# Patient Record
Sex: Female | Born: 1975 | Hispanic: Yes | Marital: Married | Smoking: Never smoker
Health system: Southern US, Community
[De-identification: ages and names within clinical notes are randomized; demographics above are authoritative.]

## PROBLEM LIST (undated history)

## (undated) DIAGNOSIS — G43909 Migraine, unspecified, not intractable, without status migrainosus: Secondary | ICD-10-CM

## (undated) DIAGNOSIS — N6310 Unspecified lump in the right breast, unspecified quadrant: Secondary | ICD-10-CM

## (undated) DIAGNOSIS — M546 Pain in thoracic spine: Secondary | ICD-10-CM

## (undated) DIAGNOSIS — M545 Low back pain: Secondary | ICD-10-CM

## (undated) DIAGNOSIS — G4730 Sleep apnea, unspecified: Secondary | ICD-10-CM

---

## 2006-07-06 ENCOUNTER — Other Ambulatory Visit: Admission: RE | Admit: 2006-07-06 | Discharge: 2006-07-06 | Payer: Self-pay | Admitting: Obstetrics and Gynecology

## 2006-11-06 ENCOUNTER — Ambulatory Visit (HOSPITAL_COMMUNITY): Admission: RE | Admit: 2006-11-06 | Discharge: 2006-11-06 | Payer: Self-pay | Admitting: Obstetrics and Gynecology

## 2006-11-10 ENCOUNTER — Inpatient Hospital Stay (HOSPITAL_COMMUNITY): Admission: AD | Admit: 2006-11-10 | Discharge: 2006-11-10 | Payer: Self-pay | Admitting: Obstetrics and Gynecology

## 2007-03-05 ENCOUNTER — Observation Stay (HOSPITAL_COMMUNITY): Admission: AD | Admit: 2007-03-05 | Discharge: 2007-03-06 | Payer: Self-pay | Admitting: Obstetrics and Gynecology

## 2007-03-28 ENCOUNTER — Ambulatory Visit (HOSPITAL_COMMUNITY): Admission: RE | Admit: 2007-03-28 | Discharge: 2007-03-28 | Payer: Self-pay | Admitting: Obstetrics and Gynecology

## 2007-04-03 ENCOUNTER — Inpatient Hospital Stay (HOSPITAL_COMMUNITY): Admission: AD | Admit: 2007-04-03 | Discharge: 2007-04-05 | Payer: Self-pay | Admitting: Obstetrics and Gynecology

## 2007-05-01 ENCOUNTER — Ambulatory Visit: Admission: RE | Admit: 2007-05-01 | Discharge: 2007-05-01 | Payer: Self-pay | Admitting: Obstetrics and Gynecology

## 2007-07-16 ENCOUNTER — Other Ambulatory Visit: Admission: RE | Admit: 2007-07-16 | Discharge: 2007-07-16 | Payer: Self-pay | Admitting: Obstetrics and Gynecology

## 2008-05-03 ENCOUNTER — Emergency Department (HOSPITAL_COMMUNITY): Admission: EM | Admit: 2008-05-03 | Discharge: 2008-05-03 | Payer: Self-pay | Admitting: Emergency Medicine

## 2008-06-05 ENCOUNTER — Encounter: Admission: RE | Admit: 2008-06-05 | Discharge: 2008-06-05 | Payer: Self-pay | Admitting: Obstetrics and Gynecology

## 2008-08-20 ENCOUNTER — Inpatient Hospital Stay (HOSPITAL_COMMUNITY): Admission: AD | Admit: 2008-08-20 | Discharge: 2008-08-22 | Payer: Self-pay | Admitting: Obstetrics and Gynecology

## 2008-11-14 ENCOUNTER — Other Ambulatory Visit: Admission: RE | Admit: 2008-11-14 | Discharge: 2008-11-14 | Payer: Self-pay | Admitting: Obstetrics and Gynecology

## 2009-01-27 ENCOUNTER — Inpatient Hospital Stay (HOSPITAL_COMMUNITY): Admission: EM | Admit: 2009-01-27 | Discharge: 2009-01-29 | Payer: Self-pay | Admitting: Emergency Medicine

## 2009-01-28 ENCOUNTER — Encounter (INDEPENDENT_AMBULATORY_CARE_PROVIDER_SITE_OTHER): Payer: Self-pay | Admitting: General Surgery

## 2009-01-28 HISTORY — PX: CHOLECYSTECTOMY: SHX55

## 2009-06-04 ENCOUNTER — Ambulatory Visit (HOSPITAL_COMMUNITY): Admission: RE | Admit: 2009-06-04 | Discharge: 2009-06-04 | Payer: Self-pay | Admitting: Internal Medicine

## 2009-07-20 ENCOUNTER — Emergency Department (HOSPITAL_COMMUNITY): Admission: EM | Admit: 2009-07-20 | Discharge: 2009-07-20 | Payer: Self-pay | Admitting: Emergency Medicine

## 2009-08-12 ENCOUNTER — Emergency Department (HOSPITAL_COMMUNITY): Admission: EM | Admit: 2009-08-12 | Discharge: 2009-08-12 | Payer: Self-pay | Admitting: Emergency Medicine

## 2009-12-02 ENCOUNTER — Other Ambulatory Visit: Admission: RE | Admit: 2009-12-02 | Discharge: 2009-12-02 | Payer: Self-pay | Admitting: Obstetrics and Gynecology

## 2010-10-10 ENCOUNTER — Emergency Department (HOSPITAL_COMMUNITY)
Admission: EM | Admit: 2010-10-10 | Discharge: 2010-10-10 | Disposition: A | Discharge status: Home / Self Care | Payer: Managed Care, Other (non HMO) | Attending: Emergency Medicine | Admitting: Emergency Medicine

## 2010-10-10 DIAGNOSIS — L259 Unspecified contact dermatitis, unspecified cause: Secondary | ICD-10-CM | POA: Insufficient documentation

## 2010-10-10 DIAGNOSIS — T363X5A Adverse effect of macrolides, initial encounter: Secondary | ICD-10-CM | POA: Insufficient documentation

## 2010-10-10 DIAGNOSIS — R109 Unspecified abdominal pain: Secondary | ICD-10-CM | POA: Insufficient documentation

## 2010-11-15 ENCOUNTER — Other Ambulatory Visit (HOSPITAL_COMMUNITY)
Admission: RE | Admit: 2010-11-15 | Discharge: 2010-11-15 | Disposition: A | Discharge status: Home / Self Care | Payer: Managed Care, Other (non HMO) | Source: Ambulatory Visit | Attending: Obstetrics and Gynecology | Admitting: Obstetrics and Gynecology

## 2010-11-15 ENCOUNTER — Other Ambulatory Visit: Payer: Self-pay | Admitting: Obstetrics and Gynecology

## 2010-11-15 DIAGNOSIS — Z01419 Encounter for gynecological examination (general) (routine) without abnormal findings: Secondary | ICD-10-CM | POA: Insufficient documentation

## 2010-12-07 LAB — URINALYSIS, ROUTINE W REFLEX MICROSCOPIC
Nitrite: NEGATIVE
Protein, ur: 100 mg/dL — AB
Urobilinogen, UA: 0.2 mg/dL (ref 0.0–1.0)
pH: 7 (ref 5.0–8.0)

## 2010-12-07 LAB — DIFFERENTIAL
Basophils Absolute: 0.1 10*3/uL (ref 0.0–0.1)
Eosinophils Relative: 2 % (ref 0–5)
Lymphs Abs: 2.8 10*3/uL (ref 0.7–4.0)
Monocytes Relative: 6 % (ref 3–12)
Neutrophils Relative %: 79 % — ABNORMAL HIGH (ref 43–77)

## 2010-12-07 LAB — APTT: aPTT: 27 seconds (ref 24–37)

## 2010-12-07 LAB — BASIC METABOLIC PANEL
BUN: 10 mg/dL (ref 6–23)
CO2: 26 mEq/L (ref 19–32)
Creatinine, Ser: 0.54 mg/dL (ref 0.4–1.2)
GFR calc Af Amer: >60 mL/min (ref >60)
GFR calc non Af Amer: >60 mL/min (ref >60)
Glucose, Bld: 105 mg/dL — ABNORMAL HIGH (ref 70–99)
Sodium: 137 mEq/L (ref 135–145)

## 2010-12-07 LAB — URINE CULTURE

## 2010-12-07 LAB — URINE MICROSCOPIC-ADD ON

## 2010-12-07 LAB — CBC
RBC: 4.49 MIL/uL (ref 3.87–5.11)
RDW: 13.1 % (ref 11.5–15.5)
WBC: 21.4 10*3/uL — ABNORMAL HIGH (ref 4.0–10.5)

## 2010-12-07 LAB — GC/CHLAMYDIA PROBE AMP, GENITAL
Chlamydia, DNA Probe: NEGATIVE
GC Probe Amp, Genital: NEGATIVE

## 2010-12-07 LAB — ABO/RH: ABO/RH(D): A POS

## 2010-12-07 LAB — HCG, QUANTITATIVE, PREGNANCY: hCG, Beta Chain, Quant, S: <2 m[IU]/mL (ref <5)

## 2010-12-08 LAB — URINALYSIS, ROUTINE W REFLEX MICROSCOPIC
Glucose, UA: 100 mg/dL — AB
Hgb urine dipstick: NEGATIVE
Ketones, ur: NEGATIVE mg/dL
Nitrite: NEGATIVE
Specific Gravity, Urine: 1.009 (ref 1.005–1.030)
Urobilinogen, UA: 0.2 mg/dL (ref 0.0–1.0)
pH: 7 (ref 5.0–8.0)

## 2010-12-14 LAB — COMPREHENSIVE METABOLIC PANEL
BUN: 9 mg/dL (ref 6–23)
CO2: 26 mEq/L (ref 19–32)
Chloride: 101 mEq/L (ref 96–112)
Creatinine, Ser: 0.53 mg/dL (ref 0.4–1.2)
GFR calc non Af Amer: >60 mL/min (ref >60)
Total Bilirubin: 0.9 mg/dL (ref 0.3–1.2)

## 2010-12-14 LAB — URINALYSIS, ROUTINE W REFLEX MICROSCOPIC
Bilirubin Urine: NEGATIVE
Glucose, UA: NEGATIVE mg/dL
Ketones, ur: NEGATIVE mg/dL
pH: 6 (ref 5.0–8.0)

## 2010-12-14 LAB — DIFFERENTIAL
Basophils Absolute: 0 10*3/uL (ref 0.0–0.1)
Basophils Absolute: 0 10*3/uL (ref 0.0–0.1)
Basophils Relative: 0 % (ref 0–1)
Basophils Relative: 0 % (ref 0–1)
Eosinophils Absolute: 0.2 10*3/uL (ref 0.0–0.7)
Eosinophils Relative: 0 % (ref 0–5)
Eosinophils Relative: 2 % (ref 0–5)
Lymphocytes Relative: 9 % — ABNORMAL LOW (ref 12–46)
Monocytes Absolute: 0.9 10*3/uL (ref 0.1–1.0)
Monocytes Relative: 10 % (ref 3–12)
Neutro Abs: 12.7 10*3/uL — ABNORMAL HIGH (ref 1.7–7.7)
Neutro Abs: 6.1 10*3/uL (ref 1.7–7.7)

## 2010-12-14 LAB — CBC
HCT: 38.8 % (ref 36.0–46.0)
Hemoglobin: 12 g/dL (ref 12.0–15.0)
Hemoglobin: 13.8 g/dL (ref 12.0–15.0)
MCHC: 35.5 g/dL (ref 30.0–36.0)
MCV: 86.7 fL (ref 78.0–100.0)
MCV: 87.8 fL (ref 78.0–100.0)
RBC: 4.48 MIL/uL (ref 3.87–5.11)
RDW: 14.1 % (ref 11.5–15.5)
WBC: 14.8 10*3/uL — ABNORMAL HIGH (ref 4.0–10.5)

## 2010-12-14 LAB — POCT I-STAT 4, (NA,K, GLUC, HGB,HCT)
Glucose, Bld: 88 mg/dL (ref 70–99)
Potassium: 4.2 mEq/L (ref 3.5–5.1)

## 2010-12-14 LAB — BASIC METABOLIC PANEL
CO2: 28 mEq/L (ref 19–32)
Calcium: 8.3 mg/dL — ABNORMAL LOW (ref 8.4–10.5)
Chloride: 106 mEq/L (ref 96–112)
Glucose, Bld: 81 mg/dL (ref 70–99)
Sodium: 138 mEq/L (ref 135–145)

## 2010-12-14 LAB — HEPATIC FUNCTION PANEL
AST: 40 U/L — ABNORMAL HIGH (ref 0–37)
Bilirubin, Direct: 0.1 mg/dL (ref 0.0–0.3)
Indirect Bilirubin: 0.6 mg/dL (ref 0.3–0.9)
Total Bilirubin: 0.7 mg/dL (ref 0.3–1.2)

## 2010-12-14 LAB — PREGNANCY, URINE: Preg Test, Ur: NEGATIVE

## 2010-12-14 LAB — LIPASE, BLOOD: Lipase: 60 U/L — ABNORMAL HIGH (ref 11–59)

## 2011-01-17 IMAGING — CT CT ABDOMEN W/ CM
2 of 7 series · 13 of 46 positions shown, 18 images · IV contrast (agent unspecified)
Comparison: Abdominal ultrasound 01/27/2009.

CT ABDOMEN

CLINICAL DATA: Hematuria.  Abdominal pain.  Bleeding.

CT ABDOMEN AND PELVIS WITH CONTRAST
TECHNIQUE: Multidetector CT imaging of the abdomen and pelvis was
performed using the standard protocol following bolus
administration of intravenous contrast.
Contrast: 100 ml [id number].

[Series 2: abd_pel_with 5.0 b40f · axial · 0.66mm/px · z∈[-394,-4]mm · 10 of 91 slices shown, 15 images]
[im 7/91  soft-tissue]
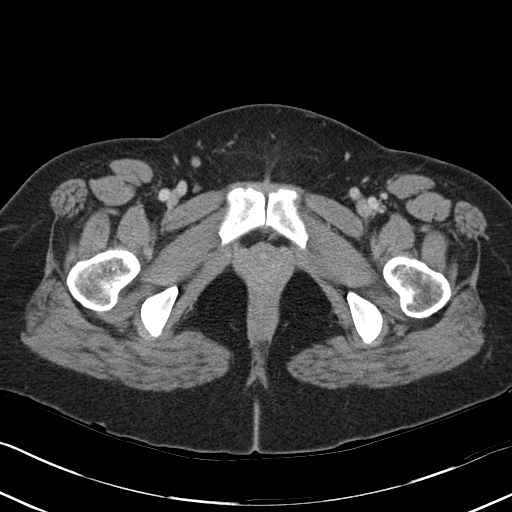
[im 7/91  bone]
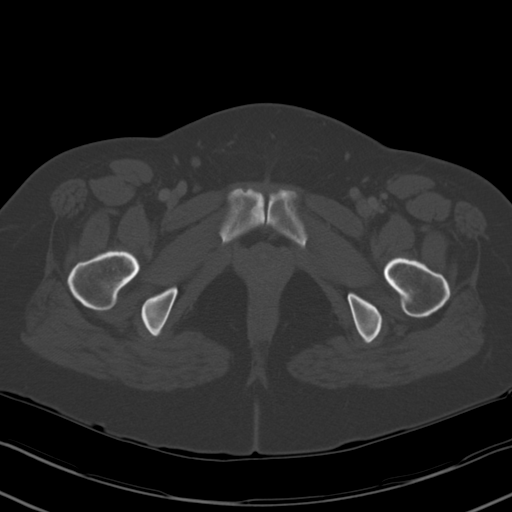
[im 19/91  soft-tissue]
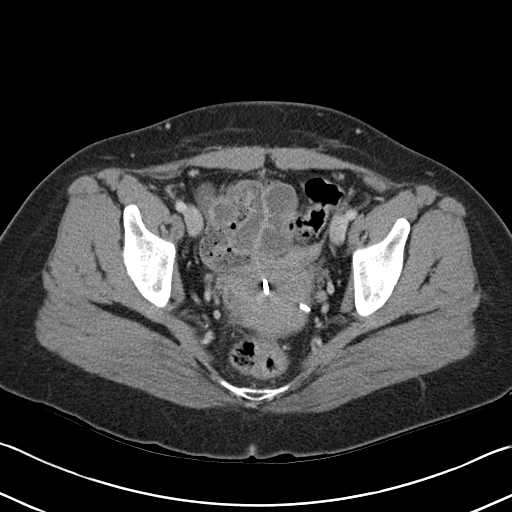
[im 25/91  soft-tissue]
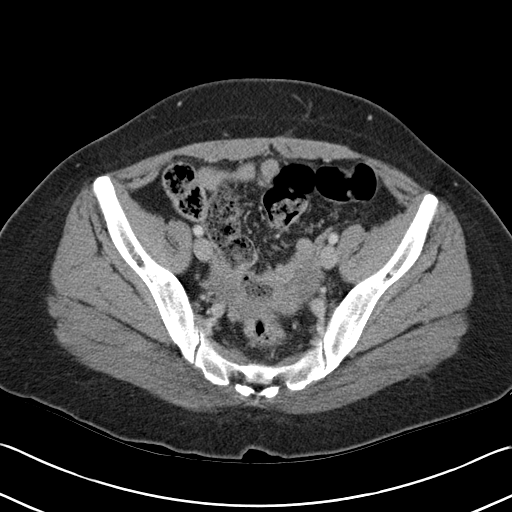
[im 37/91  soft-tissue]
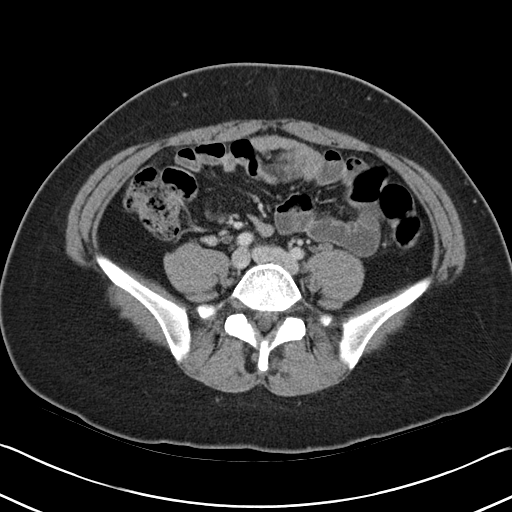
[im 49/91  soft-tissue]
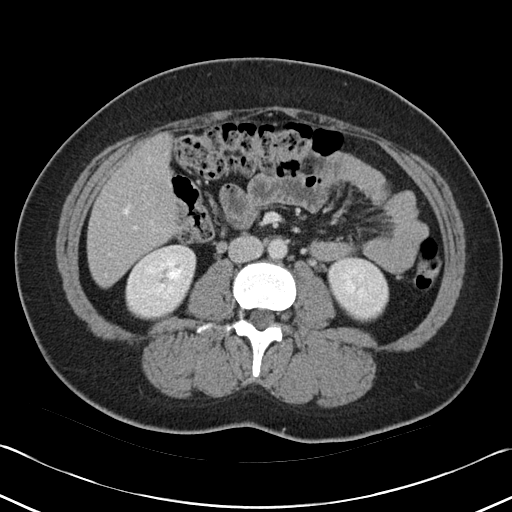
[im 55/91  soft-tissue]
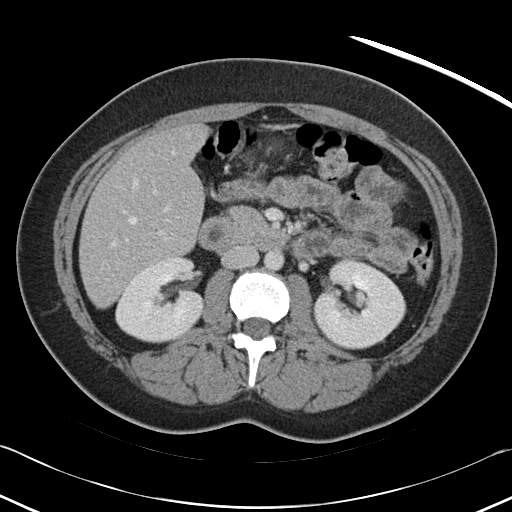
[im 67/91  soft-tissue]
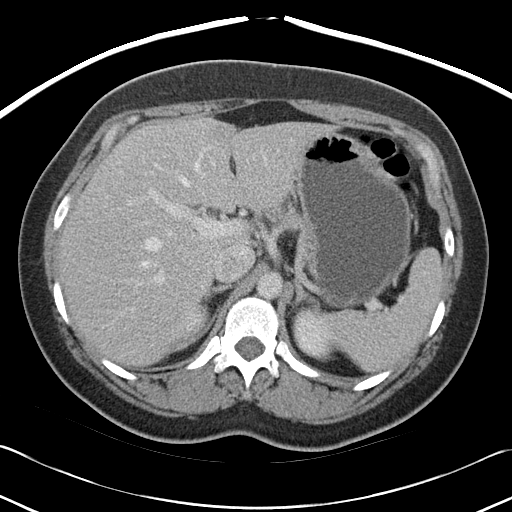
[im 67/91  lung]
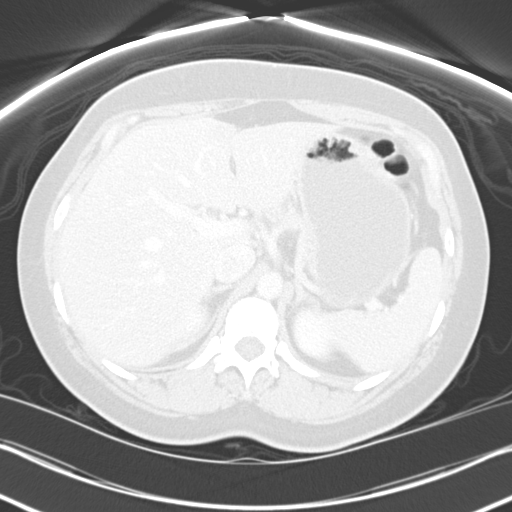
[im 73/91  soft-tissue]
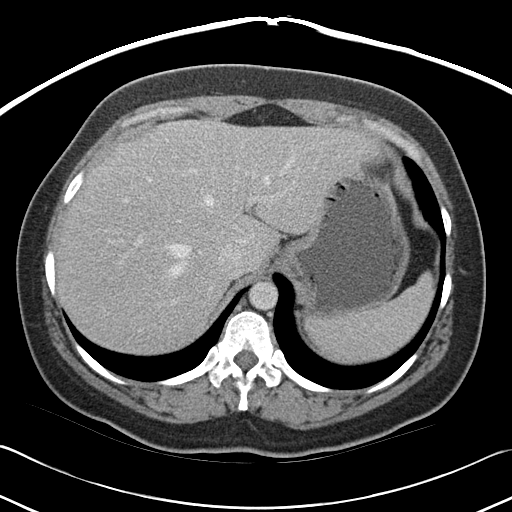
[im 73/91  lung]
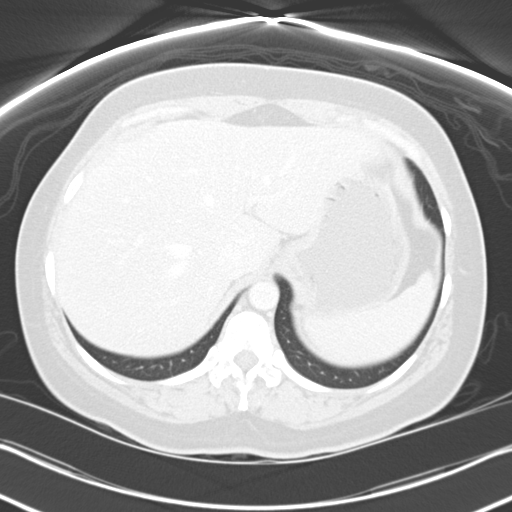
[im 79/91  lung]
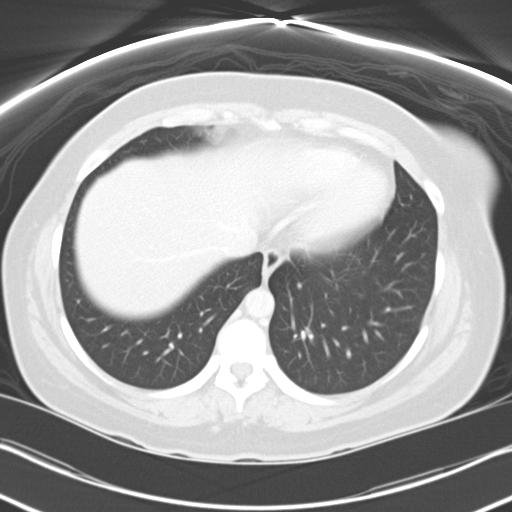
[im 85/91  soft-tissue]
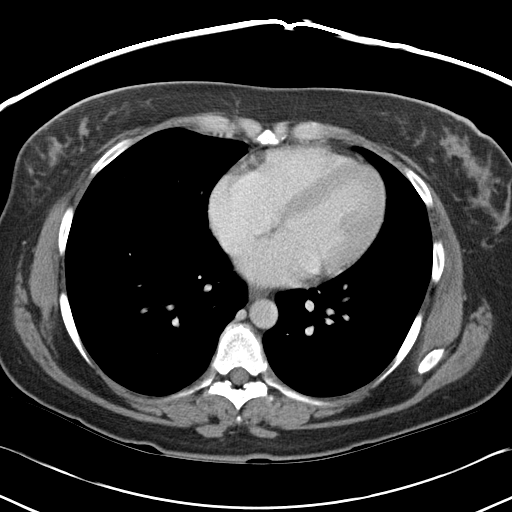
[im 85/91  lung]
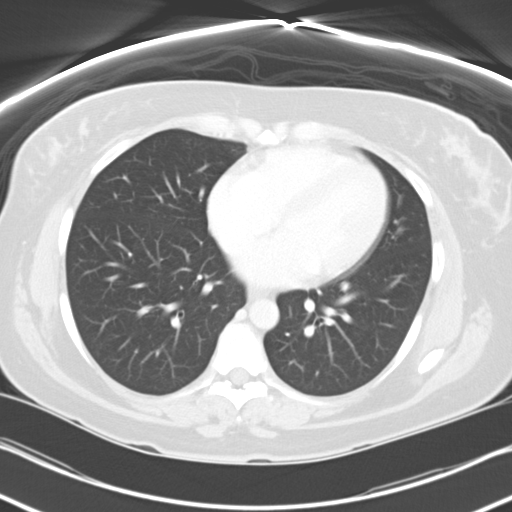
[im 85/91  bone]
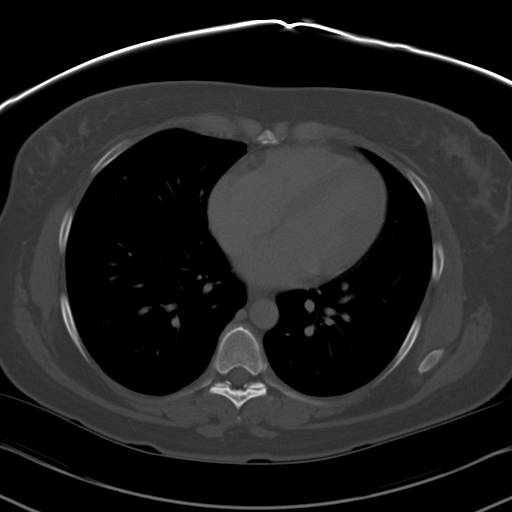

[Series 4: mpr cor post contrast (id) · coronal · 0.66mm/px · 3 of 71 slices shown]
[im 18/71  soft-tissue]
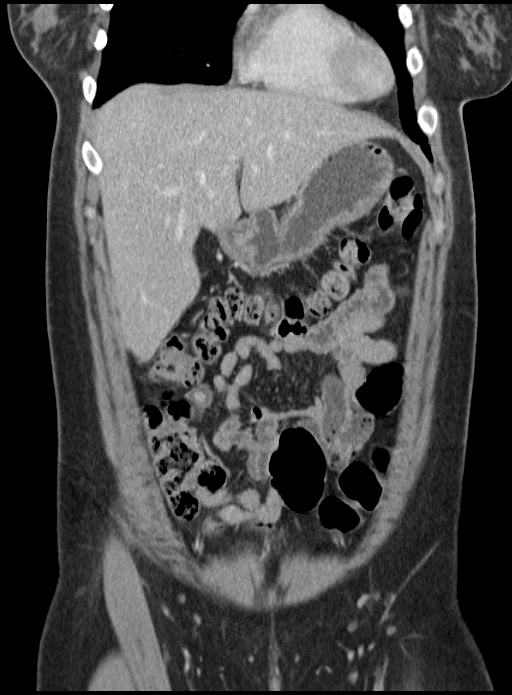
[im 36/71  soft-tissue]
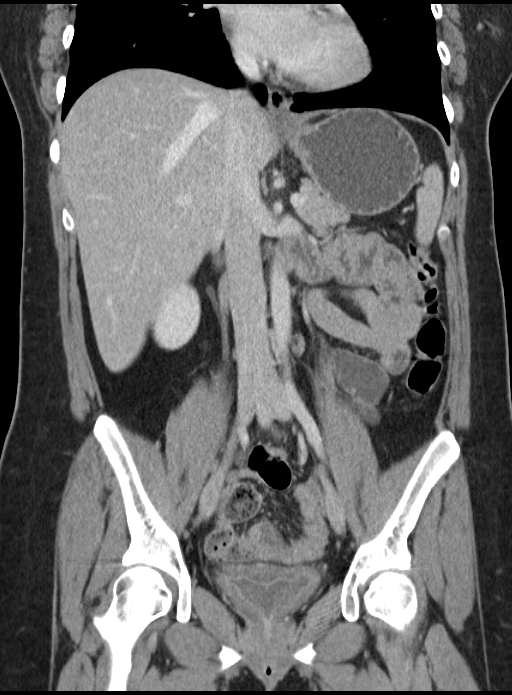
[im 53/71  soft-tissue]
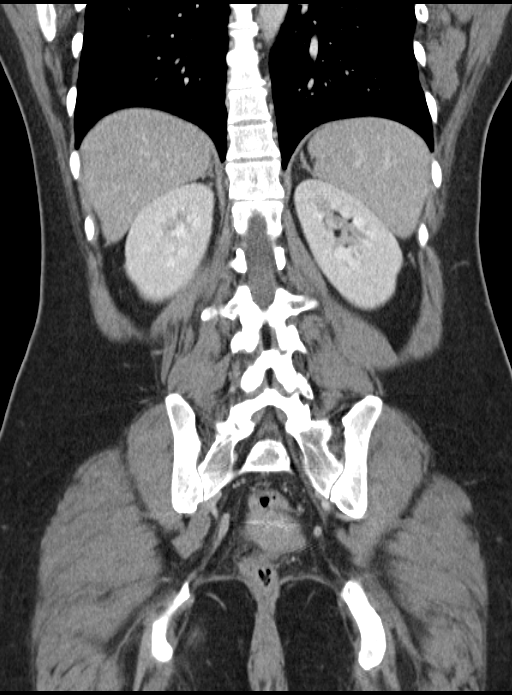

[13 of 46 positions shown; findings below may reference images not displayed]

FINDINGS: Lung bases appear clear.  Cardiopericardial silhouette
within normal limits.  Cholecystectomy.  Suggestion of mild hepatic
steatosis.  Renal enhancement and excretion of contrast is normal
bilaterally.  No obstruction or stones.  Pancreas, stomach and
small bowel appear normal.  Spleen and common bile duct
unremarkable.  No free air or free fluid.  Mild scarring is present
in the periumbilical region, likely related to laparoscopic
cholecystectomy.
IMPRESSION: 1.  No acute abdominal abnormality.
2.  Cholecystectomy.

CT PELVIS
FINDINGS: IUD is present within the uterus.  Marked bladder mural
thickening is present with a hyperenhancing mucosal surface
consistent with cystitis.  Uterus and adnexa have a physiologic
appearance.  Normal appendix.  Colon appears normal.  Bones appear
within normal limits.
IMPRESSION: 1.  Cystitis.
2.  IUD present within the uterus.

## 2011-01-18 NOTE — Consult Note (Signed)
[name], [name]               ACCOUNT NO.:  [id number]   MEDICAL RECORD NO.:  [id number]          PATIENT TYPE:  INP   LOCATION:  A318                          FACILITY:  [facility]   PHYSICIAN:  Barbaraann Barthel, M.D. DATE OF BIRTH:  Apr 03, 1976   DATE OF CONSULTATION:  01/27/2009  DATE OF DISCHARGE:                                 CONSULTATION   NOTE:  Surgery was asked to see this 35 year old [place] female who had  approximately a 5 month history of right upper quadrant pain, nausea and  vomiting.  Her last episode was in March.  Prior to this she had another  episode in December.  This pain is usually postprandial in nature and  radiates to her back and is accompanied with nausea and vomiting.  She  was admitted to the emergency room with particularly bad symptomatology  and sonogram revealed the presence of multiple stones and surgery was  consulted and responded immediately.   PHYSICAL EXAMINATION:  GENERAL:  Discloses a pleasant 35 year old  [place] female who is uncomfortable but in no acute distress.  VITAL SIGNS:  She is 5 feet 4 and weighs 147 pounds.  Her blood pressure  is 119/78.  Her pulse rate is 78 and her respirations are 20.  Her O2  sat is 100% on room air.  Rest of physical examination as follows.  HEENT:  Head is normocephalic.  Extraocular movements are intact.  Pupils were round and react to light and accommodation.  There was no  conjunctival pallor or scleral injection.  The sclerae is of normal  tincture.  Neck is supple and cylindrical without jugular vein  distention, thyromegaly, no bruits and no adenopathy.  CHEST:  Is clear both to anterior and posterior auscultation.  HEART:  Regular rhythm.  BREASTS:  Without masses and axilla without masses.  ABDOMEN:  Bowel sounds are present.  No hernias are appreciated.  She is  tender in the right upper quadrant with guarding.  RECTAL:No masses, stool firm, guiac negative.  PELVIC: Differed.  EXTREMITIES:  Her  extremities are within normal limits.   REVIEW OF SYSTEMS:  CARDIORESPIRATORY:  She is a nonsmoker, nondrinker.  No history of asthma or other cardiovascular complaints.  GI:  No past  history of hepatitis.  She has had recurrent episodes of right upper  quadrant postprandial pain radiating to her back with nausea and  vomiting.  She has no history of weight loss.  No history of bright red  rectal bleeding, black tarry stools or any history of inflammatory bowel  disease or irritable bowel syndrome.  OB/GYN:  She is a gravida 2, para  1, cesarean 1, abortus 0.  No family history of carcinoma in her breast  and her last menstrual period was in 2008 and her urine pregnancy test  is negative.  GU:  No history of dysuria.  No history of  nephrolithiasis.  ENDOCRINE:  No history of diabetes or thyroid disease.  MUSCULOSKELETAL:  Within normal limits.  Sonogram revealed  cholelithiasis with probable cholecystitis with thickening noted with no  fluid.  Rest of the ultrasound  there were no other abnormalities  encountered.   LABORATORY DATA:  The patient has a white count of 14.8 with an H and H  of 13.8 and 38.8 with 86% neutrophilia.  Her metabolic-7 shows a sodium  of 161, a potassium of 3.2, chloride of 101, a carbon dioxide of 26 and  a blood sugar of 108.  BUN is 9 with a creatinine of 0.53.  Liver  function studies are within normal limits and bilirubin is normal at  0.9.   IMPRESSION:  1. Cholecystitis secondary to cholelithiasis.  2. Mild hypokalemia secondary to vomiting.   PLAN:  She will be admitted and we will initiate antibiotics, rehydrate  and correct hypkalemia and plan for surgery at this admission.   We discussed surgery including possibility of open surgery as well as  intended laparoscopic surgery in Spanish thoroughly and all questions  were answered.  We discussed complications not limited to but including  bleeding, infection, damage to bile ducts, perforation of  organs and  transitory diarrhea.  Informed consent was obtained.   We will admit this patient and hopefully be able to put her on the  surgical schedule soon.      Barbaraann Barthel, M.D.  Electronically Signed     WB/MEDQ  D:  01/27/2009  T:  01/28/2009  Job:  [id number]   cc:   Madelin Rear. Sherwood Gambler, MD  Fax: [phone]   Kirk Ruths, M.D.  Fax: [phone]

## 2011-01-18 NOTE — Op Note (Signed)
[name], [name]               ACCOUNT NO.:  [id number]   MEDICAL RECORD NO.:  [id number]          PATIENT TYPE:  INP   LOCATION:  9130                          FACILITY:  [facility]   PHYSICIAN:  Charles A. Delcambre, MDDATE OF BIRTH:  [birth year]   DATE OF PROCEDURE:  08/20/2008  DATE OF DISCHARGE:                               OPERATIVE REPORT   PREOPERATIVE DIAGNOSIS:  Intrauterine pregnancy at 39 weeks, footling  breech, active labor, prolapsing feet in the vagina.   POSTOPERATIVE DIAGNOSIS:  Intrauterine pregnancy at 39 weeks, footling  breech, active labor, prolapsing feet in the vagina.   PROCEDURE:  Primary low transverse cesarean section.   SURGEON:  Charles A. Delcambre, MD   ASSISTANT:  None.   ANESTHESIA.:  Spinal.   FINDINGS:  Vigorous female, Apgars 8 and 9, cord arterial blood gas of  7.26, cord venous blood gas of 7.31, and 8 pounds 8 ounces.  Placenta to  L and D.   COMPLICATIONS:  Uterine atony, responded to 2 L with 20 units of Pitocin  and Methergine 0.2 mg injection injected directly into the uterus with  good response plus massage.   Instrument, sponge, and needle count correct x2.   DESCRIPTION OF PROCEDURE:  The patient was taken to the operating room  as noted above, and urgently I proceeded with cesarean section.  Spinal  was given and was adequate.  Sterile prep and drape was undertaken.  A  lower Pfannenstiel incision was made with knife, carried down to fascia.  Fascia was incised with a knife and Mayo scissors.  Rectus sheath was  released superiorly, inferiorly, and sharply.  The rectus muscles were  bluntly dissected in the midline.  Peritoneum was entered with  Metzenbaum scissors without damage to bowel, bladder or vascular  structures.  Traction was used to extend this incision.  Alexis  retractor was placed and was swept.  No bowel was trapped.  Alexis was  rolled down.  The vesicouterine peritoneum was excised with Metzenbaum  scissors and  bladder flap was bluntly dissected.  Lower uterine segment  transverse incision was made to amniotomy without damage to the infant.  Clear fluid was noted.  Vertical traction was used to extend the  incision.  Infant's buttocks were pushed upward and feet were able to be  reached in the cervix.  Hips and knees were flexed, and feet were  successfully grasped and pulled up together.  This allowed delivery of  the feet.  Fundal pressure delivered breech and up to the thorax.  Arm  was swept.  Rotation was accomplished.  Other arm was swept, and the  infant tended to want to deliver chin first out of the incision.  Head  was extended and occiput presented, and then fundal pressure delivered  the baby without difficulty.  Infant was vigorous and cried.  Urinated  on the mother's abdomen.  Cord was clamped.  Infant was shown to the  parents, handed off to Dr. Alison Murray, who was in attendance.  Cord gases  and cord blood was taken.  Placenta was manually removed after massage.  Placenta to Labor and Delivery.  Internal surface of the uterus was  wiped with a dry lap.  Uterus was externalized for repair.  A #1 chromic  running locking first layer was placed and #1 chromic imbricating non-  locking was placed in the second layer.  Two figure-of-eights near the  lateral portion of the uterine incision were used to achieve hemostasis.  Figure-of-eight suture was used near the right angle of the incision.  There was some serosal bleeding.  Two figure-of-eight sutures with 2-0  Vicryl were placed.  Hemostasis was excellent.  Irrigation was carried  out after the uterus was replaced.  Bladder flap hemostasis was good.  Uterine incision was of good hemostasis.  The ovaries and tubes were  normal bilaterally.  The fascial hemostasis was achieved with minor  electrocautery.  There was one area on the anterior fascia that was  bleeding venous.  This was figure-of-eight sutured with 2-0 Vicryl with  good  hemostasis resulting.  Electrocautery was used to the remainder of  the incision in the subcu layer and fascia was then closed with a #1  Vicryl running non-locking suture.  Irrigation was carried out.  Hemostasis was excellent after minor  electrocautery.  Verification was  achieved after further irrigation.  Skin was then closed with sterile  skin clips.  Sterile dressing was applied.  The patient was taken to the  operating room with physician in attendance having tolerated the  procedure well.      Charles A. Sydnee Cabal, MD  Electronically Signed     CAD/MEDQ  D:  08/20/2008  T:  08/20/2008  Job:  [id number]

## 2011-01-18 NOTE — Op Note (Signed)
NAMESAREA, FYFE               ACCOUNT NO.:  [id number]   MEDICAL RECORD NO.:  [id number]          PATIENT TYPE:  INP   LOCATION:  A318                          FACILITY:  [facility]   PHYSICIAN:  Barbaraann Barthel, M.D. DATE OF BIRTH:  April 23, 1976   DATE OF PROCEDURE:  01/28/2009  DATE OF DISCHARGE:                               OPERATIVE REPORT   PREOPERATIVE DIAGNOSES:  Acute cholecystitis, cholelithiasis.   POSTOPERATIVE DIAGNOSES:  Acute cholecystitis, cholelithiasis.   PROCEDURE:  Laparoscopic cholecystectomy.   SPECIMEN:  Gallbladder and stones.   WOUND CLASSIFICATION:  Contaminated.   NOTE:  This is a 35 year old [place] female, who came to the emergency  room with right upper quadrant pain, nausea, and vomiting.  She had  these episodes  for the last 5 months.  She had a sonogram which  revealed the presence of acute cholecystitis with cholelithiasis.  We  discussed surgery, discussing complications not limited to, but  including bleeding, infection, damage to bile ducts, perforation of  organs, transitory diarrhea, and the possibility of the open surgery  might be required.  Informed consent was obtained.   GROSS OPERATIVE FINDINGS:  The patient had an edematous gallbladder was  acutely inflamed a small cystic duct which was not cannulated and this  was a short cystic duct actually.  The right upper quadrant otherwise  appeared to be normal.   TECHNIQUE:  The patient was placed in the supine position after the  adequate administration of general anesthesia via endotracheal  intubation, her entire abdomen was prepped with Betadine solution and  draped in the usual manner.  We prepped with a chloral prep because the  patient had an iodine allergy (The patient was allergic to shellfish.)  We made a periumbilical incision over the umbilicus.  I grabbed the  fascia with a sharp towel clip and elevated this and put the Veress  needle through the fascia and confirmed this with  a saline drop test.  We then used the Visiport technique, placed a 11-mm cannula there, and  then under direct vision placed a 11-mm cannulae epigastrium, and two 5-  mm cannulas in the right upper quadrant laterally.  The gallbladder was  grasped.  Adhesions were taken down and these were edematous adhesions  from acute cholecystitis.  We were able to easily identify the cystic  duct, however, and this was triply silver clipped and divided very close  to the gallbladder as this was a small cystic duct.  Cystic artery was  likewise visualized and triply silver clipped and divided.  The  gallbladder was removed from the liver bed rather easily because of the  edema around the serosal aspect of the gallbladder.  Once we removed the  gallbladder from the liver bed with a hooked cautery device, we then  removed it from the abdomen using the EndoCatch device and then checked  for hemostasis using the cautery device and then irrigated with normal  saline solution.  I then elected to leave a piece of Surgicel on the  liver bed as well as a Jackson-Pratt drain which exited through a 5-mm  cannula site laterally.  We then desufflated the abdomen and closed the  fascia in the area of the epigastrium and the umbilicus with O Polysorb  suture and infiltrated 0.5% Sensorcaine to help with postoperative  comfort and closed the skin wounds with the stapling device.  The drain  was sutured in place with 3-0 nylon.  Prior to closure, all sponge,  needle, and instrument counts were found to be correct.  Estimated blood  loss was minimal.  The patient received approximately a liter of  crystalloids intraoperatively.  There were no complications.      Barbaraann Barthel, M.D.  Electronically Signed     WB/MEDQ  D:  01/28/2009  T:  01/29/2009  Job:  [id number]

## 2011-01-18 NOTE — H&P (Signed)
NAMESKYLLAR, NOTARIANNI               ACCOUNT NO.:  [id number]   MEDICAL RECORD NO.:  [id number]          PATIENT TYPE:  INP   LOCATION:  9130                          [facility]ACILITY:  [facility]   PHYSICIAN:  Charles A. Delcambre, MDDATE OF BIRTH:  Dec 15, 1975   DATE OF ADMISSION:  08/20/2008  DATE OF DISCHARGE:                              HISTORY & PHYSICAL    A 35 year old gravida 2, para 1-0-0-1, at 39 weeks previously scheduled  for cesarean section at noon today, presented after complaining of  rupture of membranes at 0100 this morning, known breech lie.  She was  examined in MAU complaining of contractions and feet were noted to be in  the vagina.  She was taken for urgent cesarean section and I came to the  hospital immediately, met her at the OR.  We did gave a spinal in the  lying position and proceeded with cesarean section, as I will dictate  later.   PAST MEDICAL HISTORY:  None.   MEDICATIONS:  Prenatal vitamins.   ALLERGIES:  IODINE/SHRIMP.   SURGICAL HISTORY:  None.   SOCIAL HISTORY:  No tobacco, ethanol, drug use, or STD exposure.  The  patient is married and in a monogamous relationship with her husband.   FAMILY HISTORY:  Noncontributory.   REVIEW OF SYSTEMS:  As noted above.   PHYSICAL EXAMINATION:  VITAL SIGNS:  Normal blood pressure.  I do not  know the admitting blood pressure, but she has been 130-140/80s with  resuscitation secondary to be being dehydrated and with nausea.  Respirations 22, pulse 100, and afebrile.  CORONARY:  Regular rate and rhythm.  LUNGS:  Clear.  ABDOMEN:  Gravid, consistent with term approximately 36 cm on [facility]  exam.  PELVIC:  Cervical exam, feet palpable coming through the cervix to the  ankles in the vagina.  Cord not palpable.  She notes active fetal  movement, but complains of active contractions.   ASSESSMENT:  Intrauterine pregnancy at 39 weeks, footling breech, active  labor,  prolapsing feet in the vagina.     Charles A.  Sydnee Cabal, MD  Electronically Signed    CAD/MEDQ  D:  08/20/2008  T:  08/20/2008  Job:  [id number]

## 2011-01-18 NOTE — H&P (Signed)
[name], [name]               ACCOUNT NO.:  [id number]   MEDICAL RECORD NO.:  [id number]          PATIENT TYPE:  INP   LOCATION:                                [facility]   PHYSICIAN:  Gerald Leitz, MD          DATE OF BIRTH:  Apr 21, 1976   DATE OF ADMISSION:  08/20/2008  DATE OF DISCHARGE:                              HISTORY & PHYSICAL   HISTORY OF PRESENT ILLNESS:  This is a 35 year old G2, P1-0-0-1 at 17  weeks estimated gestational age based on first trimester ultrasound with  estimated date of delivery, August 27, 2008.  Pregnancy is complicated  by breech presentation.  Positive fetal movement is noted by the  patient.  No leakage of fluid.  No vaginal bleeding.  No irregular  contractions.   PAST MEDICAL HISTORY:  Negative.   PAST SURGICAL HISTORY:  Cyst removed from arms bilaterally.   PAST GYN HISTORY:  No history of sexually transmitted diseases.  Last  Pap smear was on July 16, 2007, and this was normal.   MEDICATIONS:  Prenatal vitamins, iron sulfate, Darvocet p.r.n.   ALLERGIES:  SHRIMP.  No known drug allergies.   SOCIAL HISTORY:  The patient is married.  She denies tobacco, alcohol,  or illicit drug use.   REVIEW OF SYSTEMS:  Negative.   PAST OB HISTORY:  Spontaneous vaginal delivery x1 in July 2008.  Birth  weight was 7 pounds 13 ounces.   PHYSICAL EXAMINATION:  VITAL SIGNS:  Weight is 79-1/2 pounds, blood  pressure 138/70.  Fetal heart rate 140.  CARDIOVASCULAR:  Regular rate and rhythm.  LUNGS:  Clear to auscultation bilaterally.  ABDOMEN:  Gravid, nontender.  EXTREMITIES:  No clubbing, cyanosis, or edema.   Blood type A positive.  Ultrasound performed on August 04, 2008,  showed the fetus to be breech with the fetal head in the right upper  quadrant and fetal bottom in the left lower quadrant.  Placenta is  located anteriorly.  Amniotic fluid level was normal with 21 cm.  Fetal  gender is female.   IMPRESSION AND PLAN:  A 39-week  intrauterine pregnancy with breech  presentation.  Recommend a primary cesarean section due to  malpresentation.  Risks, benefits, and alternatives of the surgery were  discussed with the  patient including, but not limited to infection, bleeding, damage to  bowel, bladder, and surrounding organs with the need for further  surgery, risk of transfusion, HIV, hepatitis B, and C were discussed.  The patient voiced understanding of all risks and desires to proceed  with primary cesarean section.      Gerald Leitz, MD  Electronically Signed     TC/MEDQ  D:  08/18/2008  T:  08/19/2008  Job:  [id number]

## 2011-01-21 NOTE — Discharge Summary (Signed)
[name], [name]               ACCOUNT NO.:  [id number]   MEDICAL RECORD NO.:  [id number]          PATIENT TYPE:  INP   LOCATION:  A318                          FACILITY:  [facility]   PHYSICIAN:  Barbaraann Barthel, M.D. DATE OF BIRTH:  October 20, 1975   DATE OF ADMISSION:  01/27/2009  DATE OF DISCHARGE:  05/27/2010LH                               DISCHARGE SUMMARY   PROCEDURE:  Laparoscopic cholecystectomy.   NOTE:  This is a 35 year old [place] female, who came to the emergency  room with biliary colic with a history of this for last 5 months.  She  had a sonogram which revealed acute cholecystitis and cholelithiasis.  She was taken to surgery after discussing the indications and the risks.  Surgery was performed laparoscopically uneventfully on Jan 28, 2009.  Pathology revealed chronic cholecystitis with cholelithiasis.   Laboratory data showed that she had a white count on admission of 14.8  and on May 27, her white count was down to 9.6 and her H and H at the  time of discharge was 12.0 and 33.9.  Her electrolytes were within  normal limits.  Preoperatively, she had some mild hypokalemia which was  improved with hydration preoperatively.  Her liver function studies  remained grossly within normal limits postoperatively.   Her hospital course was uneventful.  At the time of discharge, she was  tolerating liquid.  She had no leg pain, shortness or breath, and her  wound was cleaned and her drain was removed at the time of discharge.  She had no dysuria.  We made arrangements to follow up with her  postoperatively and her discharge instructions included, do no heavy  lifting or straining.  She is permitted a full liquid and soft diet.  She is told not to take any aspirin products and she was told to keep  her wound clean with alcohol.  She is discharged on Darvocet-N 100 1  tablet every 4 hours p.r.n. pain and Phenergan suppositories 1 per  rectum every 6 hours if she should have any  nausea.  We will follow up  with her perioperatively and she is to contact us should there be any  acute changes.  She is also told to continue her prenatal vitamins as  needed.  She will follow perioperatively also with her regular  physician.      Barbaraann Barthel, M.D.  Electronically Signed     WB/MEDQ  D:  02/16/2009  T:  02/17/2009  Job:  [id number]

## 2011-01-21 NOTE — Discharge Summary (Signed)
NAMEPRESSLEY, BARSKY               ACCOUNT NO.:  [id number]   MEDICAL RECORD NO.:  [id number]          PATIENT TYPE:  INP   LOCATION:  NA                            [facility]   PHYSICIAN:  Gerald Leitz, MD          DATE OF BIRTH:  14-Jan-1976   DATE OF ADMISSION:  DATE OF DISCHARGE:                               DISCHARGE SUMMARY   ADMISSION DIAGNOSES:  1. A 39-week intrauterine pregnancy.  2. Malpresentation.  3. Active labor.   POSTOPERATIVE DIAGNOSES:  1. A 39-week intrauterine pregnancy.  2. Malpresentation.  3. Active labor.  4. Status post primary low transverse cesarean section.   BRIEF HOSPITAL COURSE:  The patient presented to maternity admissions at  [facility] complaining of labor, and she was found to be footling  breech.  She was taken to the operating room where a primary low  transverse cesarean section was performed by Loreli Dollar.  She  delivered a live born female infant with Apgars of 8 and 9 at 1 and 5  minutes respectively.  Arterial blood gas was 7.26.  The infant weighed  8 pounds 8 ounces.  She did well postoperatively.  On postop day #1, her  hemoglobin was 8.3.  She received iron sulfate.  She was discharged home  on postop day #2 in stable condition on the following medications;  Motrin, Percocet, and iron sulfate.  She was to follow up at [facility] on December 21 for staple removal.  Postpartum visit in 6 weeks.   CONDITION ON DISCHARGE:  Stable and improved.      Gerald Leitz, MD  Electronically Signed     TC/MEDQ  D:  10/18/2008  T:  10/18/2008  Job:  [id number]

## 2011-06-10 LAB — CBC
HCT: 23.6 % — ABNORMAL LOW (ref 36.0–46.0)
Hemoglobin: 11.7 g/dL — ABNORMAL LOW (ref 12.0–15.0)
MCV: 88.2 fL (ref 78.0–100.0)
Platelets: 123 10*3/uL — ABNORMAL LOW (ref 150–400)
RBC: 2.68 MIL/uL — ABNORMAL LOW (ref 3.87–5.11)
RBC: 3.9 MIL/uL (ref 3.87–5.11)
WBC: 8.8 10*3/uL (ref 4.0–10.5)
WBC: 9.7 10*3/uL (ref 4.0–10.5)

## 2011-06-10 LAB — RPR: RPR Ser Ql: NONREACTIVE

## 2011-06-20 LAB — CBC
HCT: 27.3 — ABNORMAL LOW
HCT: 37.6
Hemoglobin: 12.8
Hemoglobin: 9.4 — ABNORMAL LOW
MCHC: 34.6
MCV: 87.1
MCV: 87.8
Platelets: 176
Platelets: 205
RDW: 14.9 — ABNORMAL HIGH
RDW: 15 — ABNORMAL HIGH

## 2011-06-20 LAB — COMPREHENSIVE METABOLIC PANEL
Albumin: 2.7 — ABNORMAL LOW
Alkaline Phosphatase: 157 — ABNORMAL HIGH
BUN: 6
Chloride: 108
Creatinine, Ser: 0.44
Glucose, Bld: 84
Potassium: 4.2
Total Bilirubin: 0.5
Total Protein: 6

## 2011-06-20 LAB — URINALYSIS, ROUTINE W REFLEX MICROSCOPIC
Bilirubin Urine: NEGATIVE
Nitrite: NEGATIVE
Protein, ur: 30 — AB
Urobilinogen, UA: 0.2

## 2011-06-20 LAB — URIC ACID: Uric Acid, Serum: 4.6

## 2011-06-20 LAB — RPR: RPR Ser Ql: NONREACTIVE

## 2011-06-20 LAB — LACTATE DEHYDROGENASE: LDH: 146

## 2011-06-22 LAB — FIBRINOGEN: Fibrinogen: 650 — ABNORMAL HIGH

## 2011-06-22 LAB — URINALYSIS, ROUTINE W REFLEX MICROSCOPIC
Glucose, UA: 250 — AB
Hgb urine dipstick: NEGATIVE
Ketones, ur: NEGATIVE
Protein, ur: NEGATIVE

## 2011-06-22 LAB — URINE MICROSCOPIC-ADD ON

## 2011-06-22 LAB — CBC
MCHC: 34.4
MCV: 86.5
Platelets: 203

## 2011-11-21 ENCOUNTER — Other Ambulatory Visit (HOSPITAL_COMMUNITY)
Admission: RE | Admit: 2011-11-21 | Discharge: 2011-11-21 | Disposition: A | Discharge status: Home / Self Care | Payer: BC Managed Care – PPO | Source: Ambulatory Visit | Attending: Obstetrics and Gynecology | Admitting: Obstetrics and Gynecology

## 2011-11-21 ENCOUNTER — Other Ambulatory Visit: Payer: Self-pay | Admitting: Obstetrics and Gynecology

## 2011-11-21 DIAGNOSIS — N760 Acute vaginitis: Secondary | ICD-10-CM | POA: Insufficient documentation

## 2011-11-21 DIAGNOSIS — Z01419 Encounter for gynecological examination (general) (routine) without abnormal findings: Secondary | ICD-10-CM | POA: Insufficient documentation

## 2012-12-31 ENCOUNTER — Other Ambulatory Visit: Payer: Self-pay | Admitting: Obstetrics and Gynecology

## 2012-12-31 ENCOUNTER — Other Ambulatory Visit (HOSPITAL_COMMUNITY)
Admission: RE | Admit: 2012-12-31 | Discharge: 2012-12-31 | Disposition: A | Discharge status: Home / Self Care | Payer: BC Managed Care – PPO | Source: Ambulatory Visit | Attending: Obstetrics and Gynecology | Admitting: Obstetrics and Gynecology

## 2012-12-31 DIAGNOSIS — Z01419 Encounter for gynecological examination (general) (routine) without abnormal findings: Secondary | ICD-10-CM | POA: Insufficient documentation

## 2012-12-31 DIAGNOSIS — Z1151 Encounter for screening for human papillomavirus (HPV): Secondary | ICD-10-CM | POA: Insufficient documentation

## 2013-12-30 ENCOUNTER — Other Ambulatory Visit: Payer: Self-pay | Admitting: Obstetrics and Gynecology

## 2013-12-30 ENCOUNTER — Other Ambulatory Visit (HOSPITAL_COMMUNITY)
Admission: RE | Admit: 2013-12-30 | Discharge: 2013-12-30 | Disposition: A | Discharge status: Home / Self Care | Payer: No Typology Code available for payment source | Source: Ambulatory Visit | Attending: Obstetrics and Gynecology | Admitting: Obstetrics and Gynecology

## 2013-12-30 DIAGNOSIS — Z01419 Encounter for gynecological examination (general) (routine) without abnormal findings: Secondary | ICD-10-CM | POA: Insufficient documentation

## 2014-02-28 ENCOUNTER — Other Ambulatory Visit: Payer: Self-pay

## 2014-02-28 ENCOUNTER — Other Ambulatory Visit: Payer: Self-pay | Admitting: Obstetrics and Gynecology

## 2014-03-04 ENCOUNTER — Other Ambulatory Visit: Payer: Self-pay | Admitting: Obstetrics and Gynecology

## 2014-03-04 DIAGNOSIS — N6310 Unspecified lump in the right breast, unspecified quadrant: Secondary | ICD-10-CM

## 2014-03-14 ENCOUNTER — Other Ambulatory Visit: Payer: Managed Care, Other (non HMO)

## 2014-03-27 ENCOUNTER — Other Ambulatory Visit: Payer: Self-pay | Admitting: Obstetrics and Gynecology

## 2014-03-27 ENCOUNTER — Ambulatory Visit
Admission: RE | Admit: 2014-03-27 | Discharge: 2014-03-27 | Disposition: A | Discharge status: Home / Self Care | Payer: BC Managed Care – PPO | Source: Ambulatory Visit | Attending: Obstetrics and Gynecology | Admitting: Obstetrics and Gynecology

## 2014-03-27 DIAGNOSIS — N6310 Unspecified lump in the right breast, unspecified quadrant: Secondary | ICD-10-CM

## 2014-03-31 ENCOUNTER — Other Ambulatory Visit: Payer: Self-pay | Admitting: Obstetrics and Gynecology

## 2014-03-31 DIAGNOSIS — N6310 Unspecified lump in the right breast, unspecified quadrant: Secondary | ICD-10-CM

## 2014-04-01 ENCOUNTER — Ambulatory Visit
Admission: RE | Admit: 2014-04-01 | Discharge: 2014-04-01 | Disposition: A | Discharge status: Home / Self Care | Payer: BC Managed Care – PPO | Source: Ambulatory Visit | Attending: Obstetrics and Gynecology | Admitting: Obstetrics and Gynecology

## 2014-04-01 DIAGNOSIS — N6310 Unspecified lump in the right breast, unspecified quadrant: Secondary | ICD-10-CM

## 2014-04-17 ENCOUNTER — Ambulatory Visit (INDEPENDENT_AMBULATORY_CARE_PROVIDER_SITE_OTHER): Payer: No Typology Code available for payment source | Admitting: General Surgery

## 2014-04-17 ENCOUNTER — Encounter (INDEPENDENT_AMBULATORY_CARE_PROVIDER_SITE_OTHER): Payer: Self-pay | Admitting: General Surgery

## 2014-04-17 VITALS — BP 140/84 | HR 74 | Resp 12 | Ht 62.0 in | Wt 160.0 lb

## 2014-04-17 DIAGNOSIS — N63 Unspecified lump in unspecified breast: Secondary | ICD-10-CM

## 2014-04-17 DIAGNOSIS — N6310 Unspecified lump in the right breast, unspecified quadrant: Secondary | ICD-10-CM

## 2014-04-17 NOTE — Patient Instructions (Signed)
My office will call you next week regarding scheduling  Breast Biopsy A breast biopsy is a procedure where a sample of breast tissue is removed from your breast. The tissue is examined under a microscope to see if cancerous cells are present. A breast biopsy is done when there is:  Any undiagnosed breast mass (tumor).  Nipple abnormalities, dimpling, crusting, or ulcerations.  Abnormal discharge from the nipple, especially blood.  Redness, swelling, and pain of the breast.  Calcium deposits (calcifications) or abnormalities seen on a mammogram, ultrasound result, or results of magnetic resonance imaging (MRI).  Suspicious changes in the breast seen on your mammogram. If the tumor is found to be cancerous (malignant), a breast biopsy can help to determine what the best treatment is for you. There are many different types of breast biopsies. Talk to your caregiver about your options and which type is best for you. LET YOUR CAREGIVER KNOW ABOUT:  Allergies to food or medicine.  Medicines taken, including vitamins, herbs, eyedrops, over-the-counter medicines, and creams.  Use of steroids (by mouth or creams).  Previous problems with anesthetics or numbing medicines.  History of bleeding problems or blood clots.  Previous surgery.  Other health problems, including diabetes and kidney problems.  Any recent colds or infections.  Possibility of pregnancy, if this applies. RISKS AND COMPLICATIONS   Bleeding.  Infection.  Allergy to medicines.  Bruising and swelling of the breast.  Alteration in the shape of the breast.  Not finding the lump or abnormality.  Needing more surgery. BEFORE THE PROCEDURE  Arrange for someone to drive you home after the procedure.  Do not smoke for 2 weeks before the procedure. Stop smoking, if you smoke.  Do not drink alcohol for 24 hours before procedure.  Wear a good support bra to the procedure. PROCEDURE  You may be given a medicine  to numb the breast area (local anesthesia) or a medicine to make you sleep (general anesthesia) during the procedure. The following are the different types of biopsies that can be performed.   Fine-needle aspiration--A thin needle is attached to a syringe and inserted into the breast lump. Fluid and cells are removed and then looked at under a microscope. If the breast lump cannot be felt, an ultrasound may be used to help locate the lump and place the needle in the correct area.   Core needle biopsy--A wide, hollow needle (core needle) is inserted into the breast lump 3-6 times to get tissue samples or cores. The samples are removed. The needle is usually placed in the correct area by using an ultrasound or X-ray.   Stereotactic biopsy--X-ray equipment and a computer are used to analyze X-ray pictures of the breast lump. The computer then finds exactly where the core needle needs to be inserted. Tissue samples are removed.   Vacuum-assisted biopsy--A small incision (less than  inch) is made in your breast. A biopsy device that includes a hollow needle and vacuum is passed through the incision and into the breast tissue. The vacuum gently draws abnormal breast tissue into the needle to remove it. This type of biopsy removes a larger tissue sample than a regular core needle biopsy. No stitches are needed, and there is usually little scarring.  Ultrasound-guided core needle biopsy--A high frequency ultrasound helps guide the core needle to the area of the mass or abnormality. An incision is made to insert the needle. Tissue samples are removed.  Open biopsy--A larger incision is made in the breast. Your  caregiver will attempt to remove the whole breast lump or as much as possible. AFTER THE PROCEDURE  You will be taken to the recovery area. If you are doing well and have no problems, you will be allowed to go home.  You may notice bruising on your breast. This is normal.  Your caregiver may  apply a pressure dressing on your breast for 24-48 hours. A pressure dressing is a bandage that is wrapped tightly around the chest to stop fluid from collecting underneath tissues. Document Released: 08/22/2005 Document Revised: 12/17/2012 Document Reviewed: 09/22/2011 [facility] Patient Information 2015 [place]. This information is not intended to replace advice given to you by your health care provider. Make sure you discuss any questions you have with your health care provider.

## 2014-04-17 NOTE — Progress Notes (Signed)
Subjective:   Right breast mass  Patient ID: [name], female   DOB: 1976-04-08, 38 y.o.   MRN: [id number]  HPI Patient is a very pleasant 38 year old female referred by Dr. Christiana PellantGretchen Green for a right breast mass. The patient has no previous history of any breast disease and had not had any previous breast imaging. She recently developed abdominal pain and had a CT scan of the abdomen obtained to evaluate this. Ultimately her pain proved to be secondary to herpes zoster. Her CT scan however partially imaged a mass in the medial right breast and she was referred to the breast center for further evaluation. Subsequent mammogram and ultrasound were performed. Ultrasound revealed a oval circumscribed mass was mostly distinct borders measuring 2.3 x 1.5 cm at the 3:00 position of the right breast 10 cm from the nipple. Large core needle biopsy was recommended and performed. 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 Since knowing it was fair the patient has been able to feel a mass.  Review of Systems     Objective:   Physical Exam BP 140/84  Pulse 74  Resp 12  Ht 5\' 2"  (1.575 m)  Wt 160 lb (72.576 kg)  BMI 29.26 kg/m2 General: Well-developed female in no distress Lungs: No wheezing or increased work of breathing Lymph nodes: No cervical subclavicular or axillary nodes palpable Breasts: In the very medial aspect of the right breast at the 9:00 position is an approximately 2-1/2 cm firm rubbery freely movable mass. No other masses in either breast. No skin changes    Assessment:     Right breast mass. 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 I discussed the findings in detail with the patient and her husband. We discussed that this is very likely benign based on the size but we cannot completely rule out a small phlloides tumor which if left alone could become locally aggressive. I discussed options with the patient including  careful followup with ultrasound and physical exam versus excision. I would lean toward excision due to the small chance of a locally aggressive tumor and to avoid the frequent followup but would be required and likely ultimately result in this being removed at some point anyway. We discussed the procedure in detail including recovery and risks of anesthetic complications, bleeding, infection. After discussion they have elected to proceed with excisional biopsy.    Plan:     Right breast excisional biopsy under general anesthesia as an outpatient

## 2014-05-06 DIAGNOSIS — N6310 Unspecified lump in the right breast, unspecified quadrant: Secondary | ICD-10-CM

## 2014-05-06 HISTORY — DX: Unspecified lump in the right breast, unspecified quadrant: N63.10

## 2014-06-01 ENCOUNTER — Emergency Department (HOSPITAL_COMMUNITY): Payer: BC Managed Care – PPO

## 2014-06-01 ENCOUNTER — Emergency Department (HOSPITAL_COMMUNITY)
Admission: EM | Admit: 2014-06-01 | Discharge: 2014-06-01 | Disposition: A | Discharge status: Home / Self Care | Payer: BC Managed Care – PPO | Attending: Emergency Medicine | Admitting: Emergency Medicine

## 2014-06-01 ENCOUNTER — Encounter (HOSPITAL_COMMUNITY): Payer: Self-pay | Admitting: Emergency Medicine

## 2014-06-01 DIAGNOSIS — Z3202 Encounter for pregnancy test, result negative: Secondary | ICD-10-CM | POA: Insufficient documentation

## 2014-06-01 DIAGNOSIS — Y9389 Activity, other specified: Secondary | ICD-10-CM | POA: Insufficient documentation

## 2014-06-01 DIAGNOSIS — Z8742 Personal history of other diseases of the female genital tract: Secondary | ICD-10-CM | POA: Insufficient documentation

## 2014-06-01 DIAGNOSIS — S298XXA Other specified injuries of thorax, initial encounter: Secondary | ICD-10-CM | POA: Insufficient documentation

## 2014-06-01 DIAGNOSIS — M545 Low back pain, unspecified: Secondary | ICD-10-CM

## 2014-06-01 DIAGNOSIS — S301XXA Contusion of abdominal wall, initial encounter: Secondary | ICD-10-CM | POA: Diagnosis not present

## 2014-06-01 DIAGNOSIS — R0681 Apnea, not elsewhere classified: Secondary | ICD-10-CM | POA: Diagnosis not present

## 2014-06-01 DIAGNOSIS — Z8679 Personal history of other diseases of the circulatory system: Secondary | ICD-10-CM | POA: Insufficient documentation

## 2014-06-01 DIAGNOSIS — S20219A Contusion of unspecified front wall of thorax, initial encounter: Secondary | ICD-10-CM | POA: Insufficient documentation

## 2014-06-01 DIAGNOSIS — M546 Pain in thoracic spine: Secondary | ICD-10-CM

## 2014-06-01 DIAGNOSIS — W010XXA Fall on same level from slipping, tripping and stumbling without subsequent striking against object, initial encounter: Secondary | ICD-10-CM | POA: Insufficient documentation

## 2014-06-01 DIAGNOSIS — Z9981 Dependence on supplemental oxygen: Secondary | ICD-10-CM | POA: Insufficient documentation

## 2014-06-01 DIAGNOSIS — IMO0002 Reserved for concepts with insufficient information to code with codable children: Secondary | ICD-10-CM | POA: Insufficient documentation

## 2014-06-01 DIAGNOSIS — Y929 Unspecified place or not applicable: Secondary | ICD-10-CM | POA: Insufficient documentation

## 2014-06-01 DIAGNOSIS — S20211A Contusion of right front wall of thorax, initial encounter: Secondary | ICD-10-CM

## 2014-06-01 HISTORY — DX: Pain in thoracic spine: M54.6

## 2014-06-01 HISTORY — DX: Pain in thoracic spine: M54.50

## 2014-06-01 LAB — CBC WITH DIFFERENTIAL/PLATELET
BASOS ABS: 0 10*3/uL (ref 0.0–0.1)
BASOS PCT: 0 % (ref 0–1)
EOS ABS: 0.2 10*3/uL (ref 0.0–0.7)
Eosinophils Relative: 2 % (ref 0–5)
HCT: 38.7 % (ref 36.0–46.0)
HEMOGLOBIN: 13.5 g/dL (ref 12.0–15.0)
Lymphocytes Relative: 18 % (ref 12–46)
Lymphs Abs: 1.9 10*3/uL (ref 0.7–4.0)
MCH: 30.8 pg (ref 26.0–34.0)
MCHC: 34.9 g/dL (ref 30.0–36.0)
MCV: 88.4 fL (ref 78.0–100.0)
MONO ABS: 0.6 10*3/uL (ref 0.1–1.0)
MONOS PCT: 6 % (ref 3–12)
NEUTROS PCT: 74 % (ref 43–77)
Neutro Abs: 7.5 10*3/uL (ref 1.7–7.7)
Platelets: 233 10*3/uL (ref 150–400)
RBC: 4.38 MIL/uL (ref 3.87–5.11)
RDW: 13 % (ref 11.5–15.5)
WBC: 10.2 10*3/uL (ref 4.0–10.5)

## 2014-06-01 LAB — COMPREHENSIVE METABOLIC PANEL
ALBUMIN: 4 g/dL (ref 3.5–5.2)
ALT: 28 U/L (ref 0–35)
ANION GAP: 10 (ref 5–15)
AST: 29 U/L (ref 0–37)
Alkaline Phosphatase: 46 U/L (ref 39–117)
BUN: 8 mg/dL (ref 6–23)
CO2: 26 mEq/L (ref 19–32)
CREATININE: 0.47 mg/dL — AB (ref 0.50–1.10)
Calcium: 8.9 mg/dL (ref 8.4–10.5)
Chloride: 103 mEq/L (ref 96–112)
GFR calc Af Amer: >90 mL/min (ref >90)
GFR calc non Af Amer: >90 mL/min (ref >90)
Glucose, Bld: 100 mg/dL — ABNORMAL HIGH (ref 70–99)
POTASSIUM: 3.8 meq/L (ref 3.7–5.3)
Sodium: 139 mEq/L (ref 137–147)
TOTAL PROTEIN: 7.9 g/dL (ref 6.0–8.3)
Total Bilirubin: 0.5 mg/dL (ref 0.3–1.2)

## 2014-06-01 LAB — POC URINE PREG, ED: PREG TEST UR: NEGATIVE

## 2014-06-01 MED ORDER — ONDANSETRON HCL 4 MG/2ML IJ SOLN
4.0000 mg | Freq: Once | INTRAMUSCULAR | Status: AC
  Administered 2014-06-01: 4 mg via INTRAVENOUS
  Filled 2014-06-01: qty 2

## 2014-06-01 MED ORDER — MORPHINE SULFATE 4 MG/ML IJ SOLN
4.0000 mg | Freq: Once | INTRAMUSCULAR | Status: AC
  Administered 2014-06-01: 4 mg via INTRAVENOUS
  Filled 2014-06-01: qty 1

## 2014-06-01 MED ORDER — SODIUM CHLORIDE 0.9 % IV SOLN
Freq: Once | INTRAVENOUS | Status: AC
  Administered 2014-06-01: 13:00:00 via INTRAVENOUS

## 2014-06-01 MED ORDER — HYDROCODONE-ACETAMINOPHEN 5-325 MG PO TABS: 1.0000 | ORAL_TABLET | ORAL | Status: AC | PRN

## 2014-06-01 MED ORDER — IOHEXOL 300 MG/ML  SOLN
100.0000 mL | Freq: Once | INTRAMUSCULAR | Status: AC | PRN
  Administered 2014-06-01: 100 mL via INTRAVENOUS

## 2014-06-01 NOTE — ED Notes (Signed)
Pt states she slipped and fell down 3 cement steps. Pt fell onto right side. C/o pain to right ribs/side/hip/leg. Denies hitting head/loc/neck pain.

## 2014-06-01 NOTE — ED Provider Notes (Signed)
CSN: [id number]     Arrival date & time 06/01/14  1036 History  This chart was scribed for Burgess Amor, PA, working with Ward Givens, MD found by Elon Spanner, ED Scribe. This patient was seen in room APFT22/APFT22 and the patient's care was started at 12:38 PM.   Chief Complaint  Patient presents with  . Fall   The history is provided by the patient. No language interpreter was used.    HPI Comments: [name] is a 38 y.o. female who presents to the Emergency Department complaining of a mechanical fall that occurred earlier today.  Patient reports she was at church when her legs slipped from under her while walking down wet stairs.  Patient reports reports that her hip, right chest, and waist made contact with the ground and she reports current associated pain in these regions that makes it difficult for her to position/move properly.  Patient reports she felt SOB earlier and currently is unable to take a full breath due to sharp pain upon inspiration in her lower right chest.  Patient is scheduled for a breast lumpectomy next week.   Patient denies history of DM, HTN, CHF.  Patient denies arm pain, shoulder pain, head trauma, LOC, nausea.  LNMP 2 weeks ago.  [id number]   Past Medical History  Diagnosis Date  . Migraines   . Mass of breast, right 05/2014  . Back pain of thoracolumbar region 06/01/2014    s/p fall  . Sleep apnea     no CPAP use   Past Surgical History  Procedure Laterality Date  . Cesarean section  08/20/2008  . Cholecystectomy  01/28/2009   Family History  Problem Relation Age of Onset  . Leukemia Father    History  Substance Use Topics  . Smoking status: Never Smoker   . Smokeless tobacco: Never Used  . Alcohol Use: No   OB History   Grav Para Term Preterm Abortions TAB SAB Ect Mult Living                 Review of Systems  Constitutional: Negative for fever.  HENT: Negative for congestion and sore throat.   Eyes: Negative.   Respiratory: Negative  for chest tightness and shortness of breath.   Cardiovascular: Positive for chest pain.  Gastrointestinal: Positive for abdominal pain. Negative for nausea and vomiting.  Genitourinary: Negative.   Musculoskeletal: Positive for arthralgias and back pain. Negative for joint swelling and neck pain.  Skin: Negative.  Negative for rash and wound.  Neurological: Negative for dizziness, weakness, light-headedness, numbness and headaches.  Psychiatric/Behavioral: Negative.       Allergies  Iodine and Shellfish allergy  Home Medications   Prior to Admission medications   Medication Sig Start Date End Date Taking? Authorizing Provider  HYDROcodone-acetaminophen (NORCO/VICODIN) 5-325 MG per tablet Take 1 tablet by mouth every 4 (four) hours as needed. 06/01/14   Burgess Amor, PA-C  ibuprofen (ADVIL,MOTRIN) 200 MG tablet Take 200 mg by mouth every 6 (six) hours as needed.    Historical Provider, MD   BP 129/86  Pulse 73  Temp(Src) 98.9 F (37.2 C)  Resp 18  Ht  (1.575 m)  Wt 160 lb (72.576 kg)  BMI 29.26 kg/m2  SpO2 100%  LMP 05/18/2014 Physical Exam  Nursing note and vitals reviewed. Constitutional: She appears well-developed and well-nourished.  HENT:  Head: Normocephalic and atraumatic.  Eyes: Conjunctivae are normal.  Neck: Normal range of motion.  Cardiovascular: Normal rate,  regular rhythm, normal heart sounds and intact distal pulses.   Pulmonary/Chest: Effort normal and breath sounds normal. No respiratory distress. She has no wheezes. She exhibits tenderness.    Abdominal: Soft. Bowel sounds are normal. There is tenderness in the right upper quadrant. There is no guarding.  No bruising.  Musculoskeletal:  Tender along right posterior pelvic rim.  No mid-line lumbar pain.  No lateral hip pain.  No hip/groin pain with internal and external rotation of hip.  No right upper extremity pain.  No right knee pain.   Neurological: She is alert.  Skin: Skin is warm and dry.   Psychiatric: She has a normal mood and affect.    ED Course  Procedures (including critical care time)   DIAGNOSTIC STUDIES: Oxygen Saturation is 100% on RA, normal by my interpretation.    COORDINATION OF CARE:  12:50 PM Will order imaging and pain medication.  Patient acknowledges and agrees with plan.    Labs Review Labs Reviewed  COMPREHENSIVE METABOLIC PANEL - Abnormal; Notable for the following:    Glucose, Bld 100 (*)    Creatinine, Ser 0.47 (*)    All other components within normal limits  CBC WITH DIFFERENTIAL  POC URINE PREG, ED    Imaging Review No results found.   EKG Interpretation None      MDM   Final diagnoses:  Chest wall contusion, right, initial encounter  Abdominal contusion, initial encounter  Right-sided low back pain without sciatica    Patients labs and/or radiological studies were viewed and considered during the medical decision making and disposition process. Pt with fall landing with right side of body against step edges, chest wall pain, abdominal pain with no evidence of internal injury per CT studies.  Pt was prescribed hydrocodone, cautioned re sedation. Ice therapy x 2-3 days, then switch to heat.  Prn f/u with pcp if sx are not improving over the week.  I personally performed the services described in this documentation, which was scribed in my presence. The recorded information has been reviewed and is accurate.   Burgess Amor, PA-C 06/03/14 2214

## 2014-06-01 NOTE — Discharge Instructions (Signed)
Back Pain, Adult °Low back pain is very common. About 1 in 5 people have back pain. The cause of low back pain is rarely dangerous. The pain often gets better over time. About half of people with a sudden onset of back pain feel better in just 2 weeks. About 8 in 10 people feel better by 6 weeks.  °CAUSES °Some common causes of back pain include: °· Strain of the muscles or ligaments supporting the spine. °· Wear and tear (degeneration) of the spinal discs. °· Arthritis. °· Direct injury to the back. °DIAGNOSIS °Most of the time, the direct cause of low back pain is not known. However, back pain can be treated effectively even when the exact cause of the pain is unknown. Answering your caregiver's questions about your overall health and symptoms is one of the most accurate ways to make sure the cause of your pain is not dangerous. If your caregiver needs more information, he or she may order lab work or imaging tests (X-rays or MRIs). However, even if imaging tests show changes in your back, this usually does not require surgery. °HOME CARE INSTRUCTIONS °For many people, back pain returns. Since low back pain is rarely dangerous, it is often a condition that people can learn to manage on their own.  °· Remain active. It is stressful on the back to sit or stand in one place. Do not sit, drive, or stand in one place for more than 30 minutes at a time. Take short walks on level surfaces as soon as pain allows. Try to increase the length of time you walk each day. °· Do not stay in bed. Resting more than 1 or 2 days can delay your recovery. °· Do not avoid exercise or work. Your body is made to move. It is not dangerous to be active, even though your back may hurt. Your back will likely heal faster if you return to being active before your pain is gone. °· Pay attention to your body when you  bend and lift. Many people have less discomfort when lifting if they bend their knees, keep the load close to their bodies, and  avoid twisting. Often, the most comfortable positions are those that put less stress on your recovering back. °· Find a comfortable position to sleep. Use a firm mattress and lie on your side with your knees slightly bent. If you lie on your back, put a pillow under your knees. °· Only take over-the-counter or prescription medicines as directed by your caregiver. Over-the-counter medicines to reduce pain and inflammation are often the most helpful. Your caregiver may prescribe muscle relaxant drugs. These medicines help dull your pain so you can more quickly return to your normal activities and healthy exercise. °· Put ice on the injured area. °¨ Put ice in a plastic bag. °¨ Place a towel between your skin and the bag. °¨ Leave the ice on for 15-20 minutes, 03-04 times a day for the first 2 to 3 days. After that, ice and heat may be alternated to reduce pain and spasms. °· Ask your caregiver about trying back exercises and gentle massage. This may be of some benefit. °· Avoid feeling anxious or stressed. Stress increases muscle tension and can worsen back pain. It is important to recognize when you are anxious or stressed and learn ways to manage it. Exercise is a great option. °SEEK MEDICAL CARE IF: °· You have pain that is not relieved with rest or medicine. °· You have pain that does not improve in 1 week. °· You have new symptoms. °· You are generally not feeling well. °SEEK   IMMEDIATE MEDICAL CARE IF:   You have pain that radiates from your back into your legs.  You develop new bowel or bladder control problems.  You have unusual weakness or numbness in your arms or legs.  You develop nausea or vomiting.  You develop abdominal pain.  You feel faint. Document Released: 08/22/2005 Document Revised: 02/21/2012 Document Reviewed: 12/24/2013 [facility] Patient Information 2015 [place], [place]. This information is not intended to replace advice given to you by your health care provider. Make sure you  discuss any questions you have with your health care provider.  Chest Contusion A contusion is a deep bruise. Bruises happen when an injury causes bleeding under the skin. Signs of bruising include pain, puffiness (swelling), and discolored skin. The bruise may turn blue, purple, or yellow.  HOME CARE  Put ice on the injured area.  Put ice in a plastic bag.  Place a towel between the skin and the bag.  Leave the ice on for 15-20 minutes at a time, 03-04 times a day for the first 48 hours.  Only take medicine as told by your doctor.  Rest.  Take deep breaths (deep-breathing exercises) as told by your doctor.  Stop smoking if you smoke.  Do not lift objects over 5 pounds (2.3 kilograms) for 3 days or longer if told by your doctor. GET HELP RIGHT AWAY IF:   You have more bruising or puffiness.  You have pain that gets worse.  You have trouble breathing.  You are dizzy, weak, or pass out (faint).  You have blood in your pee (urine) or poop (stool).  You cough up or throw up (vomit) blood.  Your puffiness or pain is not helped with medicines. MAKE SURE YOU:   Understand these instructions.  Will watch your condition.  Will get help right away if you are not doing well or get worse. Document Released: 02/08/2008 Document Revised: 05/16/2012 Document Reviewed: 02/13/2012 [facility] Patient Information 2015 [place], [place]. This information is not intended to replace advice given to you by your health care provider. Make sure you discuss any questions you have with your health care provider.   You may take the hydrocodone prescribed for pain relief.  This will make you drowsy - do not drive within 4 hours of taking this medication.  Apply an ice pack as discussed as often as is comfortable for the next 2 days.  Started on Wednesday you may also try a heating pad 20 minutes 3-4 times daily which may help your injury sites heal quicker.  You may take ibuprofen 600 mg (3 tablets)  every 8 hours with food for the next 3 days.  Please contact your surgeon to determine if he wants you to continue taking this medication beyond Wednesday giving your upcoming surgical procedure.  Your CT scans are negative for any fractures or internal organ injuries.

## 2014-06-02 ENCOUNTER — Encounter (HOSPITAL_BASED_OUTPATIENT_CLINIC_OR_DEPARTMENT_OTHER): Payer: Self-pay | Admitting: *Deleted

## 2014-06-02 NOTE — Pre-Procedure Instructions (Addendum)
Hx. of sleep apnea and no use of CPAP discussed with Dr. Ivin Booty; pt. will need to spend the night post-op.  Pt. notified of same.

## 2014-06-05 NOTE — ED Provider Notes (Signed)
Medical screening examination/treatment/procedure(s) were performed by non-physician practitioner and as supervising physician I was immediately available for consultation/collaboration.   EKG Interpretation None      Shahana Capes, MD, FACEP   Wilburt Messina L Yoshi Mancillas, MD 06/05/14 1507 

## 2014-06-09 ENCOUNTER — Encounter (HOSPITAL_BASED_OUTPATIENT_CLINIC_OR_DEPARTMENT_OTHER)
Admission: RE | Disposition: A | Discharge status: Home / Self Care | Payer: Self-pay | Source: Ambulatory Visit | Attending: General Surgery

## 2014-06-09 ENCOUNTER — Ambulatory Visit (HOSPITAL_BASED_OUTPATIENT_CLINIC_OR_DEPARTMENT_OTHER)
Admission: RE | Admit: 2014-06-09 | Discharge: 2014-06-09 | Disposition: A | Discharge status: Home / Self Care | Payer: BC Managed Care – PPO | Source: Ambulatory Visit | Attending: General Surgery | Admitting: General Surgery

## 2014-06-09 ENCOUNTER — Encounter (HOSPITAL_BASED_OUTPATIENT_CLINIC_OR_DEPARTMENT_OTHER): Payer: BC Managed Care – PPO | Admitting: Anesthesiology

## 2014-06-09 ENCOUNTER — Encounter (HOSPITAL_BASED_OUTPATIENT_CLINIC_OR_DEPARTMENT_OTHER): Payer: Self-pay

## 2014-06-09 ENCOUNTER — Ambulatory Visit (HOSPITAL_BASED_OUTPATIENT_CLINIC_OR_DEPARTMENT_OTHER): Payer: BC Managed Care – PPO | Admitting: Anesthesiology

## 2014-06-09 DIAGNOSIS — Q859 Phakomatosis, unspecified: Secondary | ICD-10-CM | POA: Insufficient documentation

## 2014-06-09 DIAGNOSIS — N6310 Unspecified lump in the right breast, unspecified quadrant: Secondary | ICD-10-CM

## 2014-06-09 DIAGNOSIS — N63 Unspecified lump in breast: Secondary | ICD-10-CM | POA: Diagnosis present

## 2014-06-09 HISTORY — DX: Pain in thoracic spine: M54.6

## 2014-06-09 HISTORY — DX: Sleep apnea, unspecified: G47.30

## 2014-06-09 HISTORY — PX: BREAST BIOPSY: SHX20

## 2014-06-09 HISTORY — DX: Migraine, unspecified, not intractable, without status migrainosus: G43.909

## 2014-06-09 HISTORY — DX: Low back pain: M54.5

## 2014-06-09 HISTORY — DX: Unspecified lump in the right breast, unspecified quadrant: N63.10

## 2014-06-09 LAB — POCT HEMOGLOBIN-HEMACUE: HEMOGLOBIN: 12.7 g/dL (ref 12.0–15.0)

## 2014-06-09 SURGERY — BREAST BIOPSY
Anesthesia: General | Site: Breast | Laterality: Right

## 2014-06-09 MED ORDER — MIDAZOLAM HCL 2 MG/2ML IJ SOLN: 1.0000 mg | INTRAMUSCULAR | Status: DC | PRN

## 2014-06-09 MED ORDER — SUFENTANIL CITRATE 50 MCG/ML IV SOLN
INTRAVENOUS | Status: DC | PRN
  Administered 2014-06-09: 10 ug via INTRAVENOUS

## 2014-06-09 MED ORDER — MIDAZOLAM HCL 5 MG/5ML IJ SOLN
INTRAMUSCULAR | Status: DC | PRN
  Administered 2014-06-09: 2 mg via INTRAVENOUS

## 2014-06-09 MED ORDER — MIDAZOLAM HCL 2 MG/ML PO SYRP: 12.0000 mg | ORAL_SOLUTION | Freq: Once | ORAL | Status: DC | PRN

## 2014-06-09 MED ORDER — DEXAMETHASONE SODIUM PHOSPHATE 4 MG/ML IJ SOLN
INTRAMUSCULAR | Status: DC | PRN
  Administered 2014-06-09: 10 mg via INTRAVENOUS

## 2014-06-09 MED ORDER — PROPOFOL 10 MG/ML IV BOLUS
INTRAVENOUS | Status: DC | PRN
  Administered 2014-06-09: 200 mg via INTRAVENOUS

## 2014-06-09 MED ORDER — CEFAZOLIN SODIUM-DEXTROSE 2-3 GM-% IV SOLR
INTRAVENOUS | Status: AC
  Filled 2014-06-09: qty 50

## 2014-06-09 MED ORDER — PROPOFOL 10 MG/ML IV EMUL
INTRAVENOUS | Status: AC
  Filled 2014-06-09: qty 100

## 2014-06-09 MED ORDER — OXYCODONE HCL 5 MG PO TABS: 5.0000 mg | ORAL_TABLET | Freq: Once | ORAL | Status: DC | PRN

## 2014-06-09 MED ORDER — OXYCODONE HCL 5 MG/5ML PO SOLN: 5.0000 mg | Freq: Once | ORAL | Status: DC | PRN

## 2014-06-09 MED ORDER — BUPIVACAINE-EPINEPHRINE (PF) 0.5% -1:200000 IJ SOLN
INTRAMUSCULAR | Status: AC
  Filled 2014-06-09: qty 90

## 2014-06-09 MED ORDER — LACTATED RINGERS IV SOLN
INTRAVENOUS | Status: DC
  Administered 2014-06-09 (×2): via INTRAVENOUS

## 2014-06-09 MED ORDER — ONDANSETRON HCL 4 MG/2ML IJ SOLN
INTRAMUSCULAR | Status: DC | PRN
  Administered 2014-06-09: 4 mg via INTRAVENOUS

## 2014-06-09 MED ORDER — MIDAZOLAM HCL 2 MG/2ML IJ SOLN
INTRAMUSCULAR | Status: AC
  Filled 2014-06-09: qty 2

## 2014-06-09 MED ORDER — PROMETHAZINE HCL 25 MG/ML IJ SOLN: 6.2500 mg | INTRAMUSCULAR | Status: DC | PRN

## 2014-06-09 MED ORDER — HYDROCODONE-ACETAMINOPHEN 5-325 MG PO TABS: 1.0000 | ORAL_TABLET | ORAL | Status: AC | PRN

## 2014-06-09 MED ORDER — SUCCINYLCHOLINE CHLORIDE 20 MG/ML IJ SOLN
INTRAMUSCULAR | Status: AC
  Filled 2014-06-09: qty 1

## 2014-06-09 MED ORDER — SCOPOLAMINE 1 MG/3DAYS TD PT72
MEDICATED_PATCH | TRANSDERMAL | Status: AC
  Filled 2014-06-09: qty 1

## 2014-06-09 MED ORDER — LIDOCAINE HCL (CARDIAC) 20 MG/ML IV SOLN
INTRAVENOUS | Status: DC | PRN
  Administered 2014-06-09: 50 mg via INTRAVENOUS

## 2014-06-09 MED ORDER — DIPHENHYDRAMINE HCL 50 MG/ML IJ SOLN
INTRAMUSCULAR | Status: DC | PRN
  Administered 2014-06-09: 6.25 mg via INTRAVENOUS

## 2014-06-09 MED ORDER — BUPIVACAINE-EPINEPHRINE 0.5% -1:200000 IJ SOLN
INTRAMUSCULAR | Status: DC | PRN
  Administered 2014-06-09: 10 mL

## 2014-06-09 MED ORDER — FENTANYL CITRATE 0.05 MG/ML IJ SOLN: 50.0000 ug | INTRAMUSCULAR | Status: DC | PRN

## 2014-06-09 MED ORDER — SCOPOLAMINE 1 MG/3DAYS TD PT72
1.0000 | MEDICATED_PATCH | TRANSDERMAL | Status: DC
  Administered 2014-06-09: 1.5 mg via TRANSDERMAL

## 2014-06-09 MED ORDER — LIDOCAINE HCL (PF) 1 % IJ SOLN
INTRAMUSCULAR | Status: AC
  Filled 2014-06-09: qty 90

## 2014-06-09 MED ORDER — SUFENTANIL CITRATE 50 MCG/ML IV SOLN
INTRAVENOUS | Status: AC
  Filled 2014-06-09: qty 1

## 2014-06-09 MED ORDER — CEFAZOLIN SODIUM-DEXTROSE 2-3 GM-% IV SOLR
2.0000 g | INTRAVENOUS | Status: AC
  Administered 2014-06-09: 2 g via INTRAVENOUS

## 2014-06-09 MED ORDER — HYDROMORPHONE HCL 1 MG/ML IJ SOLN: 0.2500 mg | INTRAMUSCULAR | Status: DC | PRN

## 2014-06-09 MED ORDER — CHLORHEXIDINE GLUCONATE 4 % EX LIQD: 1.0000 "application " | Freq: Once | CUTANEOUS | Status: DC

## 2014-06-09 SURGICAL SUPPLY — 49 items
ADH SKN CLS APL DERMABOND .7 (GAUZE/BANDAGES/DRESSINGS) ×1
BLADE SURG 10 STRL SS (BLADE) IMPLANT
BLADE SURG 15 STRL LF DISP TIS (BLADE) ×1 IMPLANT
BLADE SURG 15 STRL SS (BLADE) ×3
CANISTER SUCT 1200ML W/VALVE (MISCELLANEOUS) IMPLANT
CHLORAPREP W/TINT 26ML (MISCELLANEOUS) ×3 IMPLANT
CLIP TI MEDIUM 6 (CLIP) IMPLANT
CLIP TI WIDE RED SMALL 6 (CLIP) IMPLANT
COVER MAYO STAND STRL (DRAPES) ×3 IMPLANT
COVER TABLE BACK 60X90 (DRAPES) ×3 IMPLANT
DERMABOND ADVANCED (GAUZE/BANDAGES/DRESSINGS) ×2
DERMABOND ADVANCED .7 DNX12 (GAUZE/BANDAGES/DRESSINGS) IMPLANT
DEVICE DUBIN W/COMP PLATE 8390 (MISCELLANEOUS) IMPLANT
DRAPE PED LAPAROTOMY (DRAPES) ×3 IMPLANT
DRAPE UTILITY XL STRL (DRAPES) ×3 IMPLANT
ELECT COATED BLADE 2.86 ST (ELECTRODE) ×3 IMPLANT
ELECT REM PT RETURN 9FT ADLT (ELECTROSURGICAL) ×3
ELECTRODE REM PT RTRN 9FT ADLT (ELECTROSURGICAL) ×1 IMPLANT
GLOVE BIOGEL PI IND STRL 7.0 (GLOVE) IMPLANT
GLOVE BIOGEL PI IND STRL 8 (GLOVE) ×1 IMPLANT
GLOVE BIOGEL PI INDICATOR 7.0 (GLOVE) ×2
GLOVE BIOGEL PI INDICATOR 8 (GLOVE) ×2
GLOVE ECLIPSE 6.5 STRL STRAW (GLOVE) ×2 IMPLANT
GLOVE SS BIOGEL STRL SZ 7.5 (GLOVE) ×1 IMPLANT
GLOVE SUPERSENSE BIOGEL SZ 7.5 (GLOVE) ×2
GOWN STRL REUS W/ TWL LRG LVL3 (GOWN DISPOSABLE) ×1 IMPLANT
GOWN STRL REUS W/ TWL XL LVL3 (GOWN DISPOSABLE) ×1 IMPLANT
GOWN STRL REUS W/TWL LRG LVL3 (GOWN DISPOSABLE) ×3
GOWN STRL REUS W/TWL XL LVL3 (GOWN DISPOSABLE) ×3
NDL HYPO 25X1 1.5 SAFETY (NEEDLE) ×1 IMPLANT
NEEDLE HYPO 25X1 1.5 SAFETY (NEEDLE) ×3 IMPLANT
NS IRRIG 1000ML POUR BTL (IV SOLUTION) ×3 IMPLANT
PACK BASIN DAY SURGERY FS (CUSTOM PROCEDURE TRAY) ×3 IMPLANT
PENCIL BUTTON HOLSTER BLD 10FT (ELECTRODE) ×3 IMPLANT
SLEEVE SCD COMPRESS KNEE MED (MISCELLANEOUS) ×2 IMPLANT
STAPLER VISISTAT 35W (STAPLE) IMPLANT
SUT MON AB 5-0 PS2 18 (SUTURE) ×3 IMPLANT
SUT SILK 3 0 SH 30 (SUTURE) ×2 IMPLANT
SUT VIC AB 3-0 SH 27 (SUTURE) ×3
SUT VIC AB 3-0 SH 27X BRD (SUTURE) ×1 IMPLANT
SUT VIC AB 4-0 BRD 54 (SUTURE) IMPLANT
SUT VICRYL 3-0 CR8 SH (SUTURE) IMPLANT
SYR BULB 3OZ (MISCELLANEOUS) IMPLANT
SYR CONTROL 10ML LL (SYRINGE) ×3 IMPLANT
TOWEL OR 17X24 6PK STRL BLUE (TOWEL DISPOSABLE) ×4 IMPLANT
TOWEL OR NON WOVEN STRL DISP B (DISPOSABLE) ×3 IMPLANT
TUBE CONNECTING 20'X1/4 (TUBING)
TUBE CONNECTING 20X1/4 (TUBING) IMPLANT
YANKAUER SUCT BULB TIP NO VENT (SUCTIONS) IMPLANT

## 2014-06-09 NOTE — Anesthesia Preprocedure Evaluation (Signed)
Anesthesia Evaluation    Reviewed: Allergy & Precautions, H&P , NPO status , Patient's Chart, lab work & pertinent test results  History of Anesthesia Complications Negative for: history of anesthetic complications  Airway       Dental   Pulmonary sleep apnea ,          Cardiovascular negative cardio ROS      Neuro/Psych  Headaches,    GI/Hepatic negative GI ROS, Neg liver ROS,   Endo/Other  negative endocrine ROS  Renal/GU negative Renal ROS     Musculoskeletal   Abdominal   Peds  Hematology   Anesthesia Other Findings   Reproductive/Obstetrics                           Anesthesia Physical Anesthesia Plan  ASA: II  Anesthesia Plan: General   Post-op Pain Management:    Induction: Intravenous  Airway Management Planned: LMA  Additional Equipment:   Intra-op Plan:   Post-operative Plan: Extubation in OR  Informed Consent:   Plan Discussed with: CRNA, Anesthesiologist and Surgeon  Anesthesia Plan Comments:         Anesthesia Quick Evaluation

## 2014-06-09 NOTE — H&P (Signed)
  HPI  Patient is a very pleasant 38 year old female referred by Dr. Christiana PellantGretchen Green for a right breast mass. The patient has no previous history of any breast disease and had not had any previous breast imaging. She recently developed abdominal pain and had a CT scan of the abdomen obtained to evaluate this. Ultimately her pain proved to be secondary to herpes zoster. Her CT scan however partially imaged a mass in the medial right breast and she was referred to the breast center for further evaluation. Subsequent mammogram and ultrasound were performed. Ultrasound revealed a oval circumscribed mass was mostly distinct borders measuring 2.3 x 1.5 cm at the 3:00 position of the right breast 10 cm from the nipple. Large core needle biopsy was recommended and performed. 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 Since knowing it was fair the patient has been able to feel a mass.  Review of Systems  Objective:   Physical Exam  BP 118/78  Pulse 78  Temp(Src) 98.2 F (36.8 C)  Resp 18  Ht 5\' 2"  (1.575 m)  Wt 161 lb 8 oz (73.256 kg)  BMI 29.53 kg/m2  SpO2 99%  LMP 05/19/2014  General: Well-developed female in no distress  Lungs: No wheezing or increased work of breathing  Lymph nodes: No cervical subclavicular or axillary nodes palpable  Breasts: In the very medial aspect of the right breast at the 9:00 position is an approximately 2-1/2 cm firm rubbery freely movable mass. No other masses in either breast. No skin changes  Assessment:   Right breast mass. 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 I discussed the findings in detail with the patient and her husband. We discussed that this is very likely benign based on the size but we cannot completely rule out a small phlloides tumor which if left alone could become locally aggressive. I discussed options with the patient including careful followup with ultrasound and physical exam  versus excision. I would lean toward excision due to the small chance of a locally aggressive tumor and to avoid the frequent followup but would be required and likely ultimately result in this being removed at some point anyway. We discussed the procedure in detail including recovery and risks of anesthetic complications, bleeding, infection. After discussion they have elected to proceed with excisional biopsy.  Plan:   Right breast excisional biopsy under general anesthesia as an outpatient

## 2014-06-09 NOTE — Discharge Instructions (Signed)
[facility] °Office Phone Number [phone] ° °BREAST BIOPSY/ PARTIAL MASTECTOMY: POST OP INSTRUCTIONS ° °Always review your discharge instruction sheet given to you by the facility where your surgery was performed. ° °IF YOU HAVE DISABILITY OR FAMILY LEAVE FORMS, YOU MUST BRING THEM TO THE OFFICE FOR PROCESSING.  DO NOT GIVE THEM TO YOUR DOCTOR. ° °1. A prescription for pain medication may be given to you upon discharge.  Take your pain medication as prescribed, if needed.  If narcotic pain medicine is not needed, then you may take acetaminophen (Tylenol) or ibuprofen (Advil) as needed. °2. Take your usually prescribed medications unless otherwise directed °3. If you need a refill on your pain medication, please contact your pharmacy.  They will contact our office to request authorization.  Prescriptions will not be filled after 5pm or on week-ends. °4. You should eat very light the first 24 hours after surgery, such as soup, crackers, pudding, etc.  Resume your normal diet the day after surgery. °5. Most patients will experience some swelling and bruising in the breast.  Ice packs and a good support bra will help.  Swelling and bruising can take several days to resolve.  °6. It is common to experience some constipation if taking pain medication after surgery.  Increasing fluid intake and taking a stool softener will usually help or prevent this problem from occurring.  A mild laxative (Milk of Magnesia or Miralax) should be taken according to package directions if there are no bowel movements after 48 hours. °7. Unless discharge instructions indicate otherwise, you may remove your bandages 24-48 hours after surgery, and you may shower at that time.  You may have steri-strips (small skin tapes) in place directly over the incision.  These strips should be left on the skin for 7-10 days.  If your surgeon used skin glue on the incision, you may shower in 24 hours.  The glue will flake off over the  next 2-3 weeks.  Any sutures or staples will be removed at the office during your follow-up visit. °8. ACTIVITIES:  You may resume regular daily activities (gradually increasing) beginning the next day.  Wearing a good support bra or sports bra minimizes pain and swelling.  You may have sexual intercourse when it is comfortable. °a. You may drive when you no longer are taking prescription pain medication, you can comfortably wear a seatbelt, and you can safely maneuver your car and apply brakes. °b. RETURN TO WORK:  ______________________________________________________________________________________ °9. You should see your doctor in the office for a follow-up appointment approximately two weeks after your surgery.  Your doctor’s nurse will typically make your follow-up appointment when she calls you with your pathology report.  Expect your pathology report 2-3 business days after your surgery.  You may call to check if you do not hear from us after three days. °10. OTHER INSTRUCTIONS: _______________________________________________________________________________________________ _____________________________________________________________________________________________________________________________________ °_____________________________________________________________________________________________________________________________________ °_____________________________________________________________________________________________________________________________________ ° °WHEN TO CALL YOUR DOCTOR: °1. Fever over 101.0 °2. Nausea and/or vomiting. °3. Extreme swelling or bruising. °4. Continued bleeding from incision. °5. Increased pain, redness, or drainage from the incision. ° °The clinic staff is available to answer your questions during regular business hours.  Please don’t hesitate to call and ask to speak to one of the nurses for clinical concerns.  If you have a medical emergency, go to the nearest  emergency room or call 911.  A surgeon from [facility] is always on call at the hospital. ° °For further questions, please visit [email or web address]  ° ° °  Post Anesthesia Home Care Instructions ° °Activity: °Get plenty of rest for the remainder of the day. A responsible adult should stay with you for 24 hours following the procedure.  °For the next 24 hours, DO NOT: °-Drive a car °-Operate machinery °-Drink alcoholic beverages °-Take any medication unless instructed by your physician °-Make any legal decisions or sign important papers. ° °Meals: °Start with liquid foods such as gelatin or soup. Progress to regular foods as tolerated. Avoid greasy, spicy, heavy foods. If nausea and/or vomiting occur, drink only clear liquids until the nausea and/or vomiting subsides. Call your physician if vomiting continues. ° °Special Instructions/Symptoms: °Your throat may feel dry or sore from the anesthesia or the breathing tube placed in your throat during surgery. If this causes discomfort, gargle with warm salt water. The discomfort should disappear within 24 hours. ° °

## 2014-06-09 NOTE — Transfer of Care (Signed)
Immediate Anesthesia Transfer of Care Note  Patient: [name]  Procedure(s) Performed: Procedure(s): RIGHT BREAST EXCISIONAL BIOPSY (Right)  Patient Location: PACU  Anesthesia Type:General  Level of Consciousness: sedated  Airway & Oxygen Therapy: Patient Spontanous Breathing and Patient connected to face mask oxygen  Post-op Assessment: Report given to PACU RN and Post -op Vital signs reviewed and stable  Post vital signs: Reviewed and stable  Complications: No apparent anesthesia complications

## 2014-06-09 NOTE — Anesthesia Procedure Notes (Signed)
Procedure Name: LMA Insertion Date/Time: 06/09/2014 7:41 AM Performed by: Zenia ResidesPAYNE, Azeez Dunker D Pre-anesthesia Checklist: Patient identified, Emergency Drugs available, Suction available and Patient being monitored Patient Re-evaluated:Patient Re-evaluated prior to inductionOxygen Delivery Method: Circle System Utilized Preoxygenation: Pre-oxygenation with 100% oxygen Intubation Type: IV induction Ventilation: Mask ventilation without difficulty LMA: LMA inserted LMA Size: 4.0 Number of attempts: 1 Airway Equipment and Method: bite block Placement Confirmation: positive ETCO2 Tube secured with: Tape Dental Injury: Teeth and Oropharynx as per pre-operative assessment

## 2014-06-09 NOTE — Anesthesia Postprocedure Evaluation (Signed)
Anesthesia Post Note  Patient: [name]  Procedure(s) Performed: Procedure(s) (LRB): RIGHT BREAST EXCISIONAL BIOPSY (Right)  Anesthesia type: general  Patient location: PACU  Post pain: Pain level controlled  Post assessment: Patient's Cardiovascular Status Stable  Last Vitals:  Filed Vitals:   06/09/14 0945  BP: 108/72  Pulse: 73  Temp:   Resp: 22    Post vital signs: Reviewed and stable  Level of consciousness: sedated  Complications: No apparent anesthesia complications

## 2014-06-09 NOTE — Op Note (Signed)
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 After preoperative discussion detailed extensively elsewhere we have elected to proceed with excision of the right breast mass.    Procedure Detail: Patient was brought to the operating room, placed in the supine position on the operating table, and laryngeal mask general anesthesia induced. The right breast was widely sterilely prepped and draped.  PAS were in place. She received preoperative IV antibiotics. Patient timeout was performed the correct procedure verified. The mass was palpable in the extreme medial aspect of the right breast at the 3:00 position.  I made a transverse incision in the skin crease directly over the mass and dissection was carried down through the subcutaneous tissue to the breast capsule. The breast capsule was incised with cautery and the mass was completely excised with cautery with a small normal rim of breast tissue. It was oriented with sutures and sent for permanent pathology. The soft tissue was extensively infiltrated with Marcaine. Hemostasis was obtained with cautery. The breast and subcutaneous tissue was closed with interrupted 3-0 Vicryl and the skin with subcuticular 4-0 Monocryl and Dermabond. Sponge needle and instrument counts were correct.    Findings: As above  Estimated Blood Loss:  Minimal         Drains: none  Blood Given: none          Specimens: Right breast mass        Complications:  * No complications entered in OR log *         Disposition: PACU -  hemodynamically stable.         Condition: stable

## 2014-06-10 ENCOUNTER — Encounter (HOSPITAL_BASED_OUTPATIENT_CLINIC_OR_DEPARTMENT_OTHER): Payer: Self-pay | Admitting: General Surgery

## 2014-06-11 ENCOUNTER — Telehealth (INDEPENDENT_AMBULATORY_CARE_PROVIDER_SITE_OTHER): Payer: Self-pay | Admitting: General Surgery

## 2014-06-11 NOTE — Telephone Encounter (Signed)
Called about pathology report left a message

## 2014-06-12 ENCOUNTER — Encounter (INDEPENDENT_AMBULATORY_CARE_PROVIDER_SITE_OTHER): Payer: Self-pay
